# Patient Record
Sex: Female | Born: 1975 | Race: Black or African American | Hispanic: No | Marital: Single | Smoking: Never smoker
Health system: Southern US, Community
[De-identification: ages and names within clinical notes are randomized; demographics above are authoritative.]

## PROBLEM LIST (undated history)

## (undated) DIAGNOSIS — I219 Acute myocardial infarction, unspecified: Secondary | ICD-10-CM

## (undated) DIAGNOSIS — D649 Anemia, unspecified: Secondary | ICD-10-CM

## (undated) HISTORY — PX: TUBAL LIGATION: SHX77

---

## 2008-10-18 ENCOUNTER — Emergency Department: Payer: Self-pay | Admitting: Emergency Medicine

## 2009-01-11 ENCOUNTER — Emergency Department: Payer: Self-pay | Admitting: Emergency Medicine

## 2009-06-06 ENCOUNTER — Emergency Department: Payer: Self-pay | Admitting: Emergency Medicine

## 2009-09-25 ENCOUNTER — Emergency Department: Payer: Self-pay | Admitting: Emergency Medicine

## 2009-10-03 ENCOUNTER — Emergency Department: Payer: Self-pay | Admitting: Emergency Medicine

## 2009-11-04 ENCOUNTER — Emergency Department: Payer: Self-pay | Admitting: Emergency Medicine

## 2009-11-11 ENCOUNTER — Emergency Department: Payer: Self-pay | Admitting: Emergency Medicine

## 2009-12-03 ENCOUNTER — Emergency Department: Payer: Self-pay | Admitting: Emergency Medicine

## 2009-12-30 ENCOUNTER — Emergency Department: Payer: Self-pay | Admitting: Emergency Medicine

## 2011-07-27 ENCOUNTER — Emergency Department: Payer: Self-pay | Admitting: Emergency Medicine

## 2012-01-04 ENCOUNTER — Other Ambulatory Visit (HOSPITAL_COMMUNITY): Payer: Self-pay | Admitting: Family Medicine

## 2012-01-04 ENCOUNTER — Ambulatory Visit (HOSPITAL_COMMUNITY)
Admission: RE | Admit: 2012-01-04 | Discharge: 2012-01-04 | Disposition: A | Discharge status: Home / Self Care | Payer: Self-pay | Source: Ambulatory Visit | Attending: Family Medicine | Admitting: Family Medicine

## 2012-01-04 DIAGNOSIS — R1012 Left upper quadrant pain: Secondary | ICD-10-CM | POA: Insufficient documentation

## 2013-11-06 ENCOUNTER — Emergency Department: Payer: Self-pay | Admitting: Emergency Medicine

## 2013-11-06 LAB — URINALYSIS, COMPLETE
Glucose,UR: NEGATIVE mg/dL (ref 0–75)
Leukocyte Esterase: NEGATIVE
Nitrite: NEGATIVE
Ph: 5 (ref 4.5–8.0)
Protein: NEGATIVE
RBC,UR: 2 /HPF (ref 0–5)
Squamous Epithelial: 7
WBC UR: 7 /HPF (ref 0–5)

## 2013-11-06 LAB — TROPONIN I: Troponin-I: <0.02 ng/mL

## 2013-11-06 LAB — CBC
HCT: 37.3 % (ref 35.0–47.0)
HGB: 11.7 g/dL — ABNORMAL LOW (ref 12.0–16.0)
MCH: 23.4 pg — ABNORMAL LOW (ref 26.0–34.0)
MCV: 75 fL — ABNORMAL LOW (ref 80–100)
WBC: 7.4 10*3/uL (ref 3.6–11.0)

## 2013-11-06 LAB — COMPREHENSIVE METABOLIC PANEL
Alkaline Phosphatase: 66 U/L
BUN: 9 mg/dL (ref 7–18)
Calcium, Total: 9.3 mg/dL (ref 8.5–10.1)
Chloride: 106 mmol/L (ref 98–107)
Creatinine: 0.78 mg/dL (ref 0.60–1.30)
EGFR (African American): >60
EGFR (Non-African Amer.): >60
Glucose: 91 mg/dL (ref 65–99)
Potassium: 4.1 mmol/L (ref 3.5–5.1)
SGOT(AST): 30 U/L (ref 15–37)
Sodium: 137 mmol/L (ref 136–145)
Total Protein: 7.9 g/dL (ref 6.4–8.2)

## 2014-07-29 ENCOUNTER — Emergency Department: Payer: Self-pay | Admitting: Student

## 2014-07-29 LAB — COMPREHENSIVE METABOLIC PANEL
ALT: 14 U/L
ANION GAP: 8 (ref 7–16)
AST: 13 U/L — AB (ref 15–37)
Albumin: 3.3 g/dL — ABNORMAL LOW (ref 3.4–5.0)
Alkaline Phosphatase: 57 U/L
BUN: 10 mg/dL (ref 7–18)
Bilirubin,Total: 0.1 mg/dL — ABNORMAL LOW (ref 0.2–1.0)
CHLORIDE: 107 mmol/L (ref 98–107)
CO2: 22 mmol/L (ref 21–32)
CREATININE: 0.77 mg/dL (ref 0.60–1.30)
Calcium, Total: 8.8 mg/dL (ref 8.5–10.1)
EGFR (African American): >60
EGFR (Non-African Amer.): >60
Glucose: 78 mg/dL (ref 65–99)
Osmolality: 272 (ref 275–301)
POTASSIUM: 3.5 mmol/L (ref 3.5–5.1)
SODIUM: 137 mmol/L (ref 136–145)
TOTAL PROTEIN: 7.8 g/dL (ref 6.4–8.2)

## 2014-07-29 LAB — URINALYSIS, COMPLETE
BILIRUBIN, UR: NEGATIVE
Bacteria: NONE SEEN
Blood: NEGATIVE
GLUCOSE, UR: NEGATIVE mg/dL (ref 0–75)
Ketone: NEGATIVE
Nitrite: NEGATIVE
Ph: 5 (ref 4.5–8.0)
Protein: NEGATIVE
Specific Gravity: 1.024 (ref 1.003–1.030)
Squamous Epithelial: 3

## 2014-07-29 LAB — CBC
HCT: 36.6 % (ref 35.0–47.0)
HGB: 11.7 g/dL — ABNORMAL LOW (ref 12.0–16.0)
MCH: 24.6 pg — ABNORMAL LOW (ref 26.0–34.0)
MCHC: 32.1 g/dL (ref 32.0–36.0)
MCV: 77 fL — ABNORMAL LOW (ref 80–100)
PLATELETS: 167 10*3/uL (ref 150–440)
RBC: 4.76 10*6/uL (ref 3.80–5.20)
RDW: 15.8 % — ABNORMAL HIGH (ref 11.5–14.5)
WBC: 8.9 10*3/uL (ref 3.6–11.0)

## 2014-07-29 LAB — GC/CHLAMYDIA PROBE AMP

## 2014-07-29 LAB — WET PREP, GENITAL

## 2014-07-29 LAB — LIPASE, BLOOD: LIPASE: 114 U/L (ref 73–393)

## 2014-10-07 ENCOUNTER — Emergency Department: Payer: Self-pay | Admitting: Emergency Medicine

## 2014-10-07 LAB — CBC WITH DIFFERENTIAL/PLATELET
BASOS PCT: 1 %
Basophil #: 0.1 10*3/uL (ref 0.0–0.1)
EOS ABS: 0.1 10*3/uL (ref 0.0–0.7)
Eosinophil %: 0.8 %
HCT: 33.2 % — ABNORMAL LOW (ref 35.0–47.0)
HGB: 10.5 g/dL — ABNORMAL LOW (ref 12.0–16.0)
LYMPHS PCT: 19.3 %
Lymphocyte #: 2.1 10*3/uL (ref 1.0–3.6)
MCH: 23.4 pg — ABNORMAL LOW (ref 26.0–34.0)
MCHC: 31.6 g/dL — ABNORMAL LOW (ref 32.0–36.0)
MCV: 74 fL — ABNORMAL LOW (ref 80–100)
MONO ABS: 0.8 x10 3/mm (ref 0.2–0.9)
Monocyte %: 7.5 %
Neutrophil #: 7.6 10*3/uL — ABNORMAL HIGH (ref 1.4–6.5)
Neutrophil %: 71.4 %
PLATELETS: 214 10*3/uL (ref 150–440)
RBC: 4.47 10*6/uL (ref 3.80–5.20)
RDW: 15.3 % — AB (ref 11.5–14.5)
WBC: 10.6 10*3/uL (ref 3.6–11.0)

## 2014-10-07 LAB — COMPREHENSIVE METABOLIC PANEL
ANION GAP: 7 (ref 7–16)
AST: 21 U/L (ref 15–37)
Albumin: 3 g/dL — ABNORMAL LOW (ref 3.4–5.0)
Alkaline Phosphatase: 65 U/L
BUN: 11 mg/dL (ref 7–18)
Bilirubin,Total: 0.2 mg/dL (ref 0.2–1.0)
Calcium, Total: 8.2 mg/dL — ABNORMAL LOW (ref 8.5–10.1)
Chloride: 105 mmol/L (ref 98–107)
Co2: 25 mmol/L (ref 21–32)
Creatinine: 0.83 mg/dL (ref 0.60–1.30)
GLUCOSE: 85 mg/dL (ref 65–99)
Osmolality: 272 (ref 275–301)
POTASSIUM: 3.6 mmol/L (ref 3.5–5.1)
SGPT (ALT): 15 U/L
SODIUM: 137 mmol/L (ref 136–145)
Total Protein: 7 g/dL (ref 6.4–8.2)

## 2014-10-07 LAB — TROPONIN I

## 2014-10-07 LAB — LIPASE, BLOOD: Lipase: 101 U/L (ref 73–393)

## 2014-10-07 LAB — PROTIME-INR
INR: 1.1
Prothrombin Time: 14.5 secs (ref 11.5–14.7)

## 2014-10-07 LAB — MAGNESIUM: Magnesium: 1.3 mg/dL — ABNORMAL LOW

## 2015-07-14 ENCOUNTER — Encounter (HOSPITAL_COMMUNITY): Payer: Self-pay | Admitting: Emergency Medicine

## 2015-07-14 ENCOUNTER — Emergency Department (HOSPITAL_COMMUNITY)
Admission: EM | Admit: 2015-07-14 | Discharge: 2015-07-14 | Disposition: A | Discharge status: Home / Self Care | Payer: Self-pay | Attending: Emergency Medicine | Admitting: Emergency Medicine

## 2015-07-14 DIAGNOSIS — R112 Nausea with vomiting, unspecified: Secondary | ICD-10-CM | POA: Insufficient documentation

## 2015-07-14 DIAGNOSIS — Z3202 Encounter for pregnancy test, result negative: Secondary | ICD-10-CM | POA: Insufficient documentation

## 2015-07-14 DIAGNOSIS — R1013 Epigastric pain: Secondary | ICD-10-CM | POA: Insufficient documentation

## 2015-07-14 DIAGNOSIS — Z9851 Tubal ligation status: Secondary | ICD-10-CM | POA: Insufficient documentation

## 2015-07-14 DIAGNOSIS — R1012 Left upper quadrant pain: Secondary | ICD-10-CM | POA: Insufficient documentation

## 2015-07-14 LAB — CBC WITH DIFFERENTIAL/PLATELET
BASOS ABS: 0 10*3/uL (ref 0.0–0.1)
BASOS PCT: 1 % (ref 0–1)
Eosinophils Absolute: 0.2 10*3/uL (ref 0.0–0.7)
Eosinophils Relative: 3 % (ref 0–5)
HEMATOCRIT: 32.2 % — AB (ref 36.0–46.0)
HEMOGLOBIN: 10 g/dL — AB (ref 12.0–15.0)
LYMPHS PCT: 42 % (ref 12–46)
Lymphs Abs: 2.6 10*3/uL (ref 0.7–4.0)
MCH: 23.1 pg — ABNORMAL LOW (ref 26.0–34.0)
MCHC: 31.1 g/dL (ref 30.0–36.0)
MCV: 74.5 fL — AB (ref 78.0–100.0)
MONOS PCT: 9 % (ref 3–12)
Monocytes Absolute: 0.6 10*3/uL (ref 0.1–1.0)
NEUTROS ABS: 2.8 10*3/uL (ref 1.7–7.7)
NEUTROS PCT: 45 % (ref 43–77)
Platelets: 220 10*3/uL (ref 150–400)
RBC: 4.32 MIL/uL (ref 3.87–5.11)
RDW: 16.8 % — ABNORMAL HIGH (ref 11.5–15.5)
WBC: 6.2 10*3/uL (ref 4.0–10.5)

## 2015-07-14 LAB — URINE MICROSCOPIC-ADD ON

## 2015-07-14 LAB — URINALYSIS, ROUTINE W REFLEX MICROSCOPIC
Bilirubin Urine: NEGATIVE
Glucose, UA: NEGATIVE mg/dL
Ketones, ur: NEGATIVE mg/dL
Leukocytes, UA: NEGATIVE
NITRITE: NEGATIVE
Protein, ur: NEGATIVE mg/dL
UROBILINOGEN UA: 0.2 mg/dL (ref 0.0–1.0)
pH: 6 (ref 5.0–8.0)

## 2015-07-14 LAB — COMPREHENSIVE METABOLIC PANEL
ALBUMIN: 3.4 g/dL — AB (ref 3.5–5.0)
ALT: 10 U/L — ABNORMAL LOW (ref 14–54)
ANION GAP: 8 (ref 5–15)
AST: 14 U/L — AB (ref 15–41)
Alkaline Phosphatase: 51 U/L (ref 38–126)
BILIRUBIN TOTAL: 0.2 mg/dL — AB (ref 0.3–1.2)
BUN: 13 mg/dL (ref 6–20)
CHLORIDE: 107 mmol/L (ref 101–111)
CO2: 24 mmol/L (ref 22–32)
Calcium: 8.7 mg/dL — ABNORMAL LOW (ref 8.9–10.3)
Creatinine, Ser: 0.8 mg/dL (ref 0.44–1.00)
GFR calc Af Amer: >60 mL/min (ref >60)
GFR calc non Af Amer: >60 mL/min (ref >60)
GLUCOSE: 94 mg/dL (ref 65–99)
POTASSIUM: 3.7 mmol/L (ref 3.5–5.1)
Sodium: 139 mmol/L (ref 135–145)
TOTAL PROTEIN: 7 g/dL (ref 6.5–8.1)

## 2015-07-14 LAB — LIPASE, BLOOD: LIPASE: 18 U/L — AB (ref 22–51)

## 2015-07-14 LAB — POC URINE PREG, ED: PREG TEST UR: NEGATIVE

## 2015-07-14 MED ORDER — SUCRALFATE 1 G PO TABS: 1.0000 g | ORAL_TABLET | Freq: Three times a day (TID) | ORAL | Status: DC

## 2015-07-14 MED ORDER — PANTOPRAZOLE SODIUM 40 MG PO TBEC
40.0000 mg | DELAYED_RELEASE_TABLET | Freq: Once | ORAL | Status: AC
  Administered 2015-07-14: 40 mg via ORAL
  Filled 2015-07-14: qty 1

## 2015-07-14 MED ORDER — OMEPRAZOLE 20 MG PO CPDR: 20.0000 mg | DELAYED_RELEASE_CAPSULE | Freq: Every day | ORAL | Status: DC

## 2015-07-14 NOTE — ED Provider Notes (Signed)
CSN: [id number]     Arrival date & time 07/14/15  4098 History  This chart was scribed for Kara Nay, MD by Budd Palmer, ED Scribe. This patient was seen in room APA07/APA07 and the patient's care was started at 8:43 PM.    Chief Complaint  Patient presents with  . Abdominal Pain   The history is provided by the patient. No language interpreter was used.   HPI Comments: Kara Pearson is a 39 y.o. female who presents to the Emergency Department complaining of constant, worsening, upper abdominal pain onset 5 weeks ago. She notes associated nausea (onset 5 weeks ago), and states she is unable to keep down food. She has taken a home pregnancy test which came back negative. She has had an abnormal last menstrual period. Pt denies melena and diarrhea, as well as urinary symptoms.  History reviewed. No pertinent past medical history. Past Surgical History  Procedure Laterality Date  . Tubal ligation     History reviewed. No pertinent family history. Social History  Substance Use Topics  . Smoking status: Never Smoker   . Smokeless tobacco: Never Used  . Alcohol Use: No   OB History    No data available     Review of Systems  Gastrointestinal: Positive for nausea, vomiting and abdominal pain. Negative for diarrhea and blood in stool.  Genitourinary: Negative for dysuria and difficulty urinating.  All other systems reviewed and are negative.   Allergies  Review of patient's allergies indicates no known allergies.  Home Medications   Prior to Admission medications   Medication Sig Start Date End Date Taking? Authorizing Provider  omeprazole (PRILOSEC) 20 MG capsule Take 1 capsule (20 mg total) by mouth daily. 07/14/15   Kara Nay, MD  sucralfate (CARAFATE) 1 G tablet Take 1 tablet (1 g total) by mouth 4 (four) times daily -  with meals and at bedtime. 07/14/15   Kara Nay, MD   BP 135/65 mmHg  Pulse 72  Temp(Src) 98.2 F (36.8 C) (Oral)  Resp 20  Ht 5' 7.5" (1.715  m)  Wt 245 lb (111.131 kg)  BMI 37.78 kg/m2  SpO2 99%  LMP 07/09/2015 Physical Exam  Constitutional: She is oriented to person, place, and time. She appears well-developed and well-nourished. No distress.  HENT:  Head: Normocephalic and atraumatic.  Eyes: Pupils are equal, round, and reactive to light.  Neck: Normal range of motion.  Cardiovascular: Normal rate and intact distal pulses.   Pulmonary/Chest: No respiratory distress.  Abdominal: Soft. Normal appearance. She exhibits no distension. There is tenderness in the epigastric area and left upper quadrant.    Pain primarily in the left upper and mid epigastric area.  Minimal discomfort in the right upper quadrant  Musculoskeletal: Normal range of motion.  Neurological: She is alert and oriented to person, place, and time. No cranial nerve deficit.  Skin: Skin is warm and dry. No rash noted.  Psychiatric: She has a normal mood and affect. Her behavior is normal.  Nursing note and vitals reviewed.   ED Course  Procedures  DIAGNOSTIC STUDIES: Oxygen Saturation is 100% on RA, normal by my interpretation.    COORDINATION OF CARE: 8:46 PM - Discussed plans to order diagnostic studies. Pt advised of plan for treatment and pt agrees.  Labs Review Labs Reviewed  COMPREHENSIVE METABOLIC PANEL - Abnormal; Notable for the following:    Calcium 8.7 (*)    Albumin 3.4 (*)    AST 14 (*)    ALT  10 (*)    Total Bilirubin 0.2 (*)    All other components within normal limits  LIPASE, BLOOD - Abnormal; Notable for the following:    Lipase 18 (*)    All other components within normal limits  CBC WITH DIFFERENTIAL/PLATELET - Abnormal; Notable for the following:    Hemoglobin 10.0 (*)    HCT 32.2 (*)    MCV 74.5 (*)    MCH 23.1 (*)    RDW 16.8 (*)    All other components within normal limits  URINALYSIS, ROUTINE W REFLEX MICROSCOPIC (NOT AT [facility]) - Abnormal; Notable for the following:    Specific Gravity, Urine >1.030 (*)    Hgb  urine dipstick TRACE (*)    All other components within normal limits  URINE MICROSCOPIC-ADD ON - Abnormal; Notable for the following:    Squamous Epithelial / LPF FEW (*)    All other components within normal limits  POC URINE PREG, ED    Imaging Review No results found. I, Nikeshia Keetch L, personally reviewed and evaluated these images and lab results as part of my medical decision-making.    MDM   Final diagnoses:  Epigastric pain   I personally performed the services described in this documentation, which was scribed in my presence. The recorded information has been reviewed and considered.   Kara Nay, MD 07/17/15 2005

## 2015-07-14 NOTE — ED Notes (Signed)
Patient reports nausea and discomfort to upper abdomen. Denies vomiting and diarrhea, denies urinary symptoms, and denies vaginal discharge.

## 2015-07-14 NOTE — Discharge Instructions (Signed)
Pain of Unknown Etiology (Pain Without a Known Cause) You have come to your caregiver because of pain. Pain can occur in any part of the body. Often there is not a definite cause. If your laboratory (blood or urine) work was normal and X-rays or other studies were normal, your caregiver may treat you without knowing the cause of the pain. An example of this is the headache. Most headaches are diagnosed by taking a history. This means your caregiver asks you questions about your headaches. Your caregiver determines a treatment based on your answers. Usually testing done for headaches is normal. Often testing is not done unless there is no response to medications. Regardless of where your pain is located today, you can be given medications to make you comfortable. If no physical cause of pain can be found, most cases of pain will gradually leave as suddenly as they came.  If you have a painful condition and no reason can be found for the pain, it is important that you follow up with your caregiver. If the pain becomes worse or does not go away, it may be necessary to repeat tests and look further for a possible cause.  Only take over-the-counter or prescription medicines for pain, discomfort, or fever as directed by your caregiver.  For the protection of your privacy, test results cannot be given over the phone. Make sure you receive the results of your test. Ask how these results are to be obtained if you have not been informed. It is your responsibility to obtain your test results.  You may continue all activities unless the activities cause more pain. When the pain lessens, it is important to gradually resume normal activities. Resume activities by beginning slowly and gradually increasing the intensity and duration of the activities or exercise. During periods of severe pain, bed rest may be helpful. Lie or sit in any position that is comfortable.  Ice used for acute (sudden) conditions may be effective.  Use a large plastic bag filled with ice and wrapped in a towel. This may provide pain relief.  See your caregiver for continued problems. Your caregiver can help or refer you for exercises or physical therapy if necessary. If you were given medications for your condition, do not drive, operate machinery or power tools, or sign legal documents for 24 hours. Do not drink alcohol, take sleeping pills, or take other medications that may interfere with treatment. See your caregiver immediately if you have pain that is becoming worse and not relieved by medications. Document Released: 08/10/2001 Document Revised: 09/05/2013 Document Reviewed: 11/15/2005 [facility] Patient Information 2015 [place], [place]. This information is not intended to replace advice given to you by your health care provider. Make sure you discuss any questions you have with your health care provider.  Abdominal Pain, Women Abdominal (stomach, pelvic, or belly) pain can be caused by many things. It is important to tell your doctor:  The location of the pain.  Does it come and go or is it present all the time?  Are there things that start the pain (eating certain foods, exercise)?  Are there other symptoms associated with the pain (fever, nausea, vomiting, diarrhea)? All of this is helpful to know when trying to find the cause of the pain. CAUSES   Stomach: virus or bacteria infection, or ulcer.  Intestine: appendicitis (inflamed appendix), regional ileitis (Crohn's disease), ulcerative colitis (inflamed colon), irritable bowel syndrome, diverticulitis (inflamed diverticulum of the colon), or cancer of the stomach or intestine.  Gallbladder disease or stones in the gallbladder.  Kidney disease, kidney stones, or infection.  Pancreas infection or cancer.  Fibromyalgia (pain disorder).  Diseases of the female organs:  Uterus: fibroid (non-cancerous) tumors or infection.  Fallopian tubes: infection or tubal  pregnancy.  Ovary: cysts or tumors.  Pelvic adhesions (scar tissue).  Endometriosis (uterus lining tissue growing in the pelvis and on the pelvic organs).  Pelvic congestion syndrome (female organs filling up with blood just before the menstrual period).  Pain with the menstrual period.  Pain with ovulation (producing an egg).  Pain with an IUD (intrauterine device, birth control) in the uterus.  Cancer of the female organs.  Functional pain (pain not caused by a disease, may improve without treatment).  Psychological pain.  Depression. DIAGNOSIS  Your doctor will decide the seriousness of your pain by doing an examination.  Blood tests.  X-rays.  Ultrasound.  CT scan (computed tomography, special type of X-ray).  MRI (magnetic resonance imaging).  Cultures, for infection.  Barium enema (dye inserted in the large intestine, to better view it with X-rays).  Colonoscopy (looking in intestine with a lighted tube).  Laparoscopy (minor surgery, looking in abdomen with a lighted tube).  Major abdominal exploratory surgery (looking in abdomen with a large incision). TREATMENT  The treatment will depend on the cause of the pain.   Many cases can be observed and treated at home.  Over-the-counter medicines recommended by your caregiver.  Prescription medicine.  Antibiotics, for infection.  Birth control pills, for painful periods or for ovulation pain.  Hormone treatment, for endometriosis.  Nerve blocking injections.  Physical therapy.  Antidepressants.  Counseling with a psychologist or psychiatrist.  Minor or major surgery. HOME CARE INSTRUCTIONS   Do not take laxatives, unless directed by your caregiver.  Take over-the-counter pain medicine only if ordered by your caregiver. Do not take aspirin because it can cause an upset stomach or bleeding.  Try a clear liquid diet (broth or water) as ordered by your caregiver. Slowly move to a bland diet, as  tolerated, if the pain is related to the stomach or intestine.  Have a thermometer and take your temperature several times a day, and record it.  Bed rest and sleep, if it helps the pain.  Avoid sexual intercourse, if it causes pain.  Avoid stressful situations.  Keep your follow-up appointments and tests, as your caregiver orders.  If the pain does not go away with medicine or surgery, you may try:  Acupuncture.  Relaxation exercises (yoga, meditation).  Group therapy.  Counseling. SEEK MEDICAL CARE IF:   You notice certain foods cause stomach pain.  Your home care treatment is not helping your pain.  You need stronger pain medicine.  You want your IUD removed.  You feel faint or lightheaded.  You develop nausea and vomiting.  You develop a rash.  You are having side effects or an allergy to your medicine. SEEK IMMEDIATE MEDICAL CARE IF:   Your pain does not go away or gets worse.  You have a fever.  Your pain is felt only in portions of the abdomen. The right side could possibly be appendicitis. The left lower portion of the abdomen could be colitis or diverticulitis.  You are passing blood in your stools (bright red or black tarry stools, with or without vomiting).  You have blood in your urine.  You develop chills, with or without a fever.  You pass out. MAKE SURE YOU:   Understand these instructions.  Will watch your condition.  Will get help right away if you are not doing well or get worse. Document Released: 09/12/2007 Document Revised: 04/01/2014 Document Reviewed: 10/02/2009 [facility] Patient Information 2015 [place], [place]. This information is not intended to replace advice given to you by your health care provider. Make sure you discuss any questions you have with your health care provider.

## 2016-01-27 ENCOUNTER — Emergency Department
Admission: EM | Admit: 2016-01-27 | Discharge: 2016-01-27 | Disposition: A | Discharge status: Home / Self Care | Payer: Self-pay | Attending: Emergency Medicine | Admitting: Emergency Medicine

## 2016-01-27 ENCOUNTER — Encounter: Payer: Self-pay | Admitting: Emergency Medicine

## 2016-01-27 ENCOUNTER — Emergency Department: Payer: Self-pay

## 2016-01-27 DIAGNOSIS — Z79899 Other long term (current) drug therapy: Secondary | ICD-10-CM | POA: Insufficient documentation

## 2016-01-27 DIAGNOSIS — R0789 Other chest pain: Secondary | ICD-10-CM | POA: Insufficient documentation

## 2016-01-27 LAB — CBC
HCT: 33.4 % — ABNORMAL LOW (ref 35.0–47.0)
Hemoglobin: 10.7 g/dL — ABNORMAL LOW (ref 12.0–16.0)
MCH: 23.3 pg — ABNORMAL LOW (ref 26.0–34.0)
MCHC: 32.2 g/dL (ref 32.0–36.0)
MCV: 72.6 fL — AB (ref 80.0–100.0)
PLATELETS: 165 10*3/uL (ref 150–440)
RBC: 4.6 MIL/uL (ref 3.80–5.20)
RDW: 17.6 % — AB (ref 11.5–14.5)
WBC: 5.3 10*3/uL (ref 3.6–11.0)

## 2016-01-27 LAB — BASIC METABOLIC PANEL WITH GFR
Anion gap: 5 (ref 5–15)
BUN: 12 mg/dL (ref 6–20)
CO2: 23 mmol/L (ref 22–32)
Calcium: 8.7 mg/dL — ABNORMAL LOW (ref 8.9–10.3)
Chloride: 110 mmol/L (ref 101–111)
Creatinine, Ser: 0.67 mg/dL (ref 0.44–1.00)
GFR calc Af Amer: >60 mL/min
GFR calc non Af Amer: >60 mL/min
Glucose, Bld: 105 mg/dL — ABNORMAL HIGH (ref 65–99)
Potassium: 3.6 mmol/L (ref 3.5–5.1)
Sodium: 138 mmol/L (ref 135–145)

## 2016-01-27 LAB — FIBRIN DERIVATIVES D-DIMER (ARMC ONLY): Fibrin derivatives D-dimer (ARMC): 627 — ABNORMAL HIGH (ref 0–499)

## 2016-01-27 LAB — TROPONIN I: Troponin I: <0.03 ng/mL (ref <0.031)

## 2016-01-27 MED ORDER — KETOROLAC TROMETHAMINE 30 MG/ML IJ SOLN
30.0000 mg | Freq: Once | INTRAMUSCULAR | Status: AC
  Administered 2016-01-27: 30 mg via INTRAMUSCULAR

## 2016-01-27 MED ORDER — NAPROXEN 500 MG PO TABS: 500.0000 mg | ORAL_TABLET | Freq: Two times a day (BID) | ORAL | Status: DC

## 2016-01-27 MED ORDER — KETOROLAC TROMETHAMINE 30 MG/ML IJ SOLN
INTRAMUSCULAR | Status: AC
  Administered 2016-01-27: 30 mg via INTRAMUSCULAR
  Filled 2016-01-27: qty 1

## 2016-01-27 NOTE — ED Notes (Signed)
C/o chest pain with inspiration.  Onset of symptoms today at 0830.

## 2016-01-27 NOTE — ED Provider Notes (Signed)
[facility] Emergency Department Provider Note  ____________________________________________    I have reviewed the triage vital signs and the nursing notes.   HISTORY  Chief Complaint Chest Pain    HPI STEPHANY POORMAN is a 40 y.o. female who presents with complaints of chest pain. Patient reports she works at [employer] and pushes heavy objects around most of the day. Today she developed discomfort in her superior bilateral chest worse with pushing objects. She complains the pain is aching in nature. She reports this started approximately 8:30 this morning soon after arriving to work. She denies shortness of breath. No recent travel. No calf pain. No diaphoresis. No history of heart disease     History reviewed. No pertinent past medical history.  There are no active problems to display for this patient.   Past Surgical History  Procedure Laterality Date  . Tubal ligation      Current Outpatient Rx  Name  Route  Sig  Dispense  Refill  . omeprazole (PRILOSEC) 20 MG capsule   Oral   Take 1 capsule (20 mg total) by mouth daily.   20 capsule   0   . sucralfate (CARAFATE) 1 G tablet   Oral   Take 1 tablet (1 g total) by mouth 4 (four) times daily -  with meals and at bedtime.   10 tablet   0     Allergies Review of patient's allergies indicates no known allergies.  No family history on file.  Social History Social History  Substance Use Topics  . Smoking status: Never Smoker   . Smokeless tobacco: Never Used  . Alcohol Use: No    Review of Systems  Constitutional: Negative for fever. Eyes: Negative for visual changes. ENT: Negative for neck pain Cardiovascular: As above Respiratory: Negative for shortness of breath. No cough Gastrointestinal: Negative for abdominal pain, Genitourinary: Negative for dysuria. Musculoskeletal: Negative for back pain. Skin: Negative for rash. No diaphoresis Neurological: Negative for focal  weakness Psychiatric: No anxiety    ____________________________________________   PHYSICAL EXAM:  VITAL SIGNS: ED Triage Vitals  Enc Vitals Group     BP 01/27/16 1142 125/76 mmHg     Pulse Rate 01/27/16 1142 75     Resp 01/27/16 1142 16     Temp 01/27/16 1142 98.4 F (36.9 C)     Temp Source 01/27/16 1142 Oral     SpO2 01/27/16 1142 99 %     Weight 01/27/16 1142 235 lb (106.595 kg)     Height 01/27/16 1142  (1.727 m)     Head Cir --      Peak Flow --      Pain Score 01/27/16 1143 7     Pain Loc --      Pain Edu? --      Excl. in GC? --      Constitutional: Alert and oriented. Well appearing and in no distress. Eyes: Conjunctivae are normal.  ENT   Head: Normocephalic and atraumatic.   Mouth/Throat: Mucous membranes are moist. Cardiovascular: Normal rate, regular rhythm. Normal and symmetric distal pulses are present in all extremities. No murmurs, rubs, or gallops. Patient with reproducible tenderness to palpation along the bilateral pectoralis major muscles and along the borders of the superior sternum, worse with extension of the arms Respiratory: Normal respiratory effort without tachypnea nor retractions. Breath sounds are clear and equal bilaterally.  Gastrointestinal: Soft and non-tender in all quadrants. No distention. There is no CVA tenderness.  Genitourinary: deferred Musculoskeletal: Nontender with normal range of motion in all extremities. No lower extremity tenderness nor edema. Neurologic:  Normal speech and language. No gross focal neurologic deficits are appreciated. Skin:  Skin is warm, dry and intact. No rash noted. Psychiatric: Mood and affect are normal. Patient exhibits appropriate insight and judgment.  ____________________________________________    LABS (pertinent positives/negatives)  Labs Reviewed  BASIC METABOLIC PANEL - Abnormal; Notable for the following:    Glucose, Bld 105 (*)    Calcium 8.7 (*)    All other components  within normal limits  CBC - Abnormal; Notable for the following:    Hemoglobin 10.7 (*)    HCT 33.4 (*)    MCV 72.6 (*)    MCH 23.3 (*)    RDW 17.6 (*)    All other components within normal limits  TROPONIN I  FIBRIN DERIVATIVES D-DIMER ([facility] ONLY)    ____________________________________________   EKG  ED ECG REPORT I, Jene Every, the attending physician, personally viewed and interpreted this ECG.  Date: 01/27/2016 EKG Time: 11:38 AM Rate: 76 Rhythm: normal sinus rhythm QRS Axis: normal Intervals: normal ST/T Wave abnormalities: normal Conduction Disturbances: none Narrative Interpretation: unremarkable   ____________________________________________    RADIOLOGY I have personally reviewed any xrays that were ordered on this patient: Chest x-ray normal  ____________________________________________   PROCEDURES  Procedure(s) performed: none  Critical Care performed: none  ____________________________________________   INITIAL IMPRESSION / ASSESSMENT AND PLAN / ED COURSE  Pertinent labs & imaging results that were available during my care of the patient were reviewed by me and considered in my medical decision making (see chart for details).  Patient well-appearing and in no distress. Her EKG is reassuring. Her troponin is normal. History of present illness is not consistent with ACS, dissection, myocarditis. I have added on a d-dimer although risk for PE is very low. I suspect muscular skeletal chest pain. We will give Toradol IM and reevaluated  ----------------------------------------- 3:02 PM on 01/27/2016 -----------------------------------------  Patient has had total relief of her chest discomfort after the Toradol. I discussed her mildly elevated d-dimer with her. Recommended CT scan despite the very low risk of blood clot but she declined. She opted to return if her pain returns.  ____________________________________________   FINAL  CLINICAL IMPRESSION(S) / ED DIAGNOSES  Final diagnoses:  Anterior chest wall pain     Jene Every, MD 01/27/16 1943

## 2016-01-27 NOTE — Discharge Instructions (Signed)

## 2016-05-17 ENCOUNTER — Emergency Department (HOSPITAL_COMMUNITY): Payer: Self-pay

## 2016-05-17 ENCOUNTER — Encounter (HOSPITAL_COMMUNITY): Payer: Self-pay | Admitting: Emergency Medicine

## 2016-05-17 ENCOUNTER — Emergency Department (HOSPITAL_COMMUNITY)
Admission: EM | Admit: 2016-05-17 | Discharge: 2016-05-18 | Disposition: A | Discharge status: Home / Self Care | Payer: Self-pay | Attending: Emergency Medicine | Admitting: Emergency Medicine

## 2016-05-17 DIAGNOSIS — R1030 Lower abdominal pain, unspecified: Secondary | ICD-10-CM

## 2016-05-17 DIAGNOSIS — N760 Acute vaginitis: Secondary | ICD-10-CM | POA: Insufficient documentation

## 2016-05-17 DIAGNOSIS — R112 Nausea with vomiting, unspecified: Secondary | ICD-10-CM | POA: Insufficient documentation

## 2016-05-17 DIAGNOSIS — A599 Trichomoniasis, unspecified: Secondary | ICD-10-CM

## 2016-05-17 DIAGNOSIS — Z6834 Body mass index (BMI) 34.0-34.9, adult: Secondary | ICD-10-CM | POA: Insufficient documentation

## 2016-05-17 DIAGNOSIS — R102 Pelvic and perineal pain: Secondary | ICD-10-CM

## 2016-05-17 DIAGNOSIS — B9689 Other specified bacterial agents as the cause of diseases classified elsewhere: Secondary | ICD-10-CM

## 2016-05-17 DIAGNOSIS — E669 Obesity, unspecified: Secondary | ICD-10-CM | POA: Insufficient documentation

## 2016-05-17 LAB — CBC
HCT: 34.7 % — ABNORMAL LOW (ref 36.0–46.0)
HEMOGLOBIN: 10.9 g/dL — AB (ref 12.0–15.0)
MCH: 23.9 pg — ABNORMAL LOW (ref 26.0–34.0)
MCHC: 31.4 g/dL (ref 30.0–36.0)
MCV: 75.9 fL — ABNORMAL LOW (ref 78.0–100.0)
PLATELETS: 219 10*3/uL (ref 150–400)
RBC: 4.57 MIL/uL (ref 3.87–5.11)
RDW: 17.2 % — ABNORMAL HIGH (ref 11.5–15.5)
WBC: 6.3 10*3/uL (ref 4.0–10.5)

## 2016-05-17 LAB — COMPREHENSIVE METABOLIC PANEL WITH GFR
ALT: 9 U/L — ABNORMAL LOW (ref 14–54)
AST: 14 U/L — ABNORMAL LOW (ref 15–41)
Albumin: 3.7 g/dL (ref 3.5–5.0)
Alkaline Phosphatase: 51 U/L (ref 38–126)
Anion gap: 5 (ref 5–15)
BUN: 12 mg/dL (ref 6–20)
CO2: 25 mmol/L (ref 22–32)
Calcium: 8.7 mg/dL — ABNORMAL LOW (ref 8.9–10.3)
Chloride: 109 mmol/L (ref 101–111)
Creatinine, Ser: 0.81 mg/dL (ref 0.44–1.00)
GFR calc Af Amer: >60 mL/min
GFR calc non Af Amer: >60 mL/min
Glucose, Bld: 87 mg/dL (ref 65–99)
Potassium: 3.5 mmol/L (ref 3.5–5.1)
Sodium: 139 mmol/L (ref 135–145)
Total Bilirubin: 0.3 mg/dL (ref 0.3–1.2)
Total Protein: 7.4 g/dL (ref 6.5–8.1)

## 2016-05-17 LAB — URINALYSIS, ROUTINE W REFLEX MICROSCOPIC
BILIRUBIN URINE: NEGATIVE
Glucose, UA: NEGATIVE mg/dL
Ketones, ur: NEGATIVE mg/dL
Leukocytes, UA: NEGATIVE
NITRITE: NEGATIVE
pH: 5.5 (ref 5.0–8.0)

## 2016-05-17 LAB — URINE MICROSCOPIC-ADD ON

## 2016-05-17 LAB — WET PREP, GENITAL
Sperm: NONE SEEN
YEAST WET PREP: NONE SEEN

## 2016-05-17 LAB — LIPASE, BLOOD: Lipase: 18 U/L (ref 11–51)

## 2016-05-17 LAB — PREGNANCY, URINE: Preg Test, Ur: NEGATIVE

## 2016-05-17 MED ORDER — DIATRIZOATE MEGLUMINE & SODIUM 66-10 % PO SOLN
ORAL | Status: AC
  Administered 2016-05-17: 21:00:00
  Filled 2016-05-17: qty 30

## 2016-05-17 MED ORDER — IOPAMIDOL (ISOVUE-300) INJECTION 61%
100.0000 mL | Freq: Once | INTRAVENOUS | Status: AC | PRN
  Administered 2016-05-17: 100 mL via INTRAVENOUS

## 2016-05-17 MED ORDER — OXYCODONE-ACETAMINOPHEN 5-325 MG PO TABS: 1.0000 | ORAL_TABLET | ORAL | Status: DC | PRN

## 2016-05-17 MED ORDER — SODIUM CHLORIDE 0.9 % IV BOLUS (SEPSIS)
1000.0000 mL | Freq: Once | INTRAVENOUS | Status: AC
  Administered 2016-05-17: 1000 mL via INTRAVENOUS

## 2016-05-17 MED ORDER — METRONIDAZOLE 500 MG PO TABS: 500.0000 mg | ORAL_TABLET | Freq: Two times a day (BID) | ORAL | Status: DC

## 2016-05-17 NOTE — Discharge Instructions (Signed)
You have an infection called bacterial vaginosis and trichomonas. Prescription for antibiotic and pain medicine. Follow-up your primary care doctor.

## 2016-05-17 NOTE — ED Provider Notes (Addendum)
CSN: [id number]     Arrival date & time 05/17/16  1832 History   First MD Initiated Contact with Patient 05/17/16 1934     Chief Complaint  Patient presents with  . Abdominal Pain  . Emesis     (Consider location/radiation/quality/duration/timing/severity/associated sxs/prior Treatment) HPI.Marland KitchenMarland KitchenMarland KitchenLower abdominal pain for 3 weeks. This is atypical for patient. She is normally healthy. Her menstrual period has been erratic. Status post tubal ligation. She is sexually active with her boyfriend. Review systems positive for nausea and vomiting. Minimal vaginal discharge. No vaginal bleeding. She is drinking fluids but not eating as well.  History reviewed. No pertinent past medical history. Past Surgical History  Procedure Laterality Date  . Tubal ligation     History reviewed. No pertinent family history. Social History  Substance Use Topics  . Smoking status: Never Smoker   . Smokeless tobacco: Never Used  . Alcohol Use: No   OB History    No data available     Review of Systems  All other systems reviewed and are negative.     Allergies  Review of patient's allergies indicates no known allergies.  Home Medications   Prior to Admission medications   Not on File   BP 133/86 mmHg  Pulse 75  Temp(Src) 98.6 F (37 C) (Oral)  Resp 18  Ht  (1.727 m)  Wt 230 lb (104.327 kg)  BMI 34.98 kg/m2  SpO2 100%  LMP 05/12/2016 Physical Exam  Constitutional: She is oriented to person, place, and time.  Obese, no acute distress  HENT:  Head: Normocephalic and atraumatic.  Eyes: Conjunctivae and EOM are normal. Pupils are equal, round, and reactive to light.  Neck: Normal range of motion. Neck supple.  Cardiovascular: Normal rate and regular rhythm.   Pulmonary/Chest: Effort normal and breath sounds normal.  Abdominal: Soft. Bowel sounds are normal.  Minimal lower abdominal tenderness  Genitourinary:  Pelvic exam: Normal introitus. Cervix is slightly tender with a frothy  discharge. Uterus is slightly tender. No adnexal masses  Musculoskeletal: Normal range of motion.  Neurological: She is alert and oriented to person, place, and time.  Skin: Skin is warm and dry.  Psychiatric: She has a normal mood and affect. Her behavior is normal.  Nursing note and vitals reviewed.   ED Course  Procedures (including critical care time) Labs Review Labs Reviewed  WET PREP, GENITAL - Abnormal; Notable for the following:    Trich, Wet Prep PRESENT (*)    Clue Cells Wet Prep HPF POC PRESENT (*)    WBC, Wet Prep HPF POC FEW (*)    All other components within normal limits  COMPREHENSIVE METABOLIC PANEL - Abnormal; Notable for the following:    Calcium 8.7 (*)    AST 14 (*)    ALT 9 (*)    All other components within normal limits  CBC - Abnormal; Notable for the following:    Hemoglobin 10.9 (*)    HCT 34.7 (*)    MCV 75.9 (*)    MCH 23.9 (*)    RDW 17.2 (*)    All other components within normal limits  URINALYSIS, ROUTINE W REFLEX MICROSCOPIC (NOT AT [facility]) - Abnormal; Notable for the following:    Specific Gravity, Urine >1.030 (*)    Hgb urine dipstick TRACE (*)    Protein, ur TRACE (*)    All other components within normal limits  URINE MICROSCOPIC-ADD ON - Abnormal; Notable for the following:    Squamous Epithelial /  LPF 6-30 (*)    Bacteria, UA MANY (*)    All other components within normal limits  LIPASE, BLOOD  PREGNANCY, URINE  GC/CHLAMYDIA PROBE AMP ([place]) [facility]    Imaging Review No results found. I have personally reviewed and evaluated these images and lab results as part of my medical decision-making.   EKG Interpretation None      MDM   Final diagnoses:  Lower abdominal pain  Trichomonas infection  Bacterial vaginosis    Patient has Trichomonas and bacterial vaginosis. CT abdomen/pelvis pending.Discharge medications Flagyl 500 mg and Percocet. Discussed with Dr. Anastasia Fiedlerancour    Omeka Holben, MD 05/17/16 [id number]  Donnetta HutchingBrian  Anthony Tamburo, MD 05/17/16 2204

## 2016-05-17 NOTE — ED Provider Notes (Signed)
CT scan reassuring. Fibroid seen without acute pathology. She is tender in the right lower quadrant and lower abdomen.  Treat for bacterial vaginosis and Trichomonas per plan of Dr. Adriana Simasook. We'll obtain pelvic ultrasound tomorrow. Low suspicion for ovarian torsion.  BP 129/90 mmHg  Pulse 70  Temp(Src) 97.9 F (36.6 C) (Oral)  Resp 19  Ht 5\' 8"  (1.727 m)  Wt 230 lb (104.327 kg)  BMI 34.98 kg/m2  SpO2 97%  LMP 05/12/2016    Glynn OctaveStephen Kristi Norment, MD 05/17/16 2344

## 2016-05-17 NOTE — ED Notes (Signed)
Patient complaining of lower abdominal pain and vomiting off and on x 3 weeks.

## 2016-05-18 ENCOUNTER — Ambulatory Visit (HOSPITAL_COMMUNITY): Admit: 2016-05-18 | Payer: Self-pay

## 2016-05-18 ENCOUNTER — Inpatient Hospital Stay (HOSPITAL_COMMUNITY): Admit: 2016-05-18 | Payer: Self-pay

## 2016-05-18 ENCOUNTER — Other Ambulatory Visit (HOSPITAL_COMMUNITY): Payer: Self-pay | Admitting: Emergency Medicine

## 2016-05-18 DIAGNOSIS — R102 Pelvic and perineal pain: Secondary | ICD-10-CM

## 2016-05-19 LAB — GC/CHLAMYDIA PROBE AMP (~~LOC~~) NOT AT ARMC
Chlamydia: NEGATIVE
Neisseria Gonorrhea: NEGATIVE

## 2016-05-19 LAB — URINE CULTURE: Culture: NO GROWTH

## 2016-07-19 ENCOUNTER — Encounter (HOSPITAL_COMMUNITY): Payer: Self-pay | Admitting: Emergency Medicine

## 2016-07-19 ENCOUNTER — Emergency Department (HOSPITAL_COMMUNITY)
Admission: EM | Admit: 2016-07-19 | Discharge: 2016-07-19 | Disposition: A | Discharge status: Home / Self Care | Payer: No Typology Code available for payment source | Attending: Emergency Medicine | Admitting: Emergency Medicine

## 2016-07-19 DIAGNOSIS — Z791 Long term (current) use of non-steroidal anti-inflammatories (NSAID): Secondary | ICD-10-CM | POA: Diagnosis not present

## 2016-07-19 DIAGNOSIS — Y999 Unspecified external cause status: Secondary | ICD-10-CM | POA: Insufficient documentation

## 2016-07-19 DIAGNOSIS — S39012A Strain of muscle, fascia and tendon of lower back, initial encounter: Secondary | ICD-10-CM | POA: Diagnosis not present

## 2016-07-19 DIAGNOSIS — Y939 Activity, unspecified: Secondary | ICD-10-CM | POA: Insufficient documentation

## 2016-07-19 DIAGNOSIS — S199XXA Unspecified injury of neck, initial encounter: Secondary | ICD-10-CM | POA: Diagnosis present

## 2016-07-19 DIAGNOSIS — Y92410 Unspecified street and highway as the place of occurrence of the external cause: Secondary | ICD-10-CM | POA: Diagnosis not present

## 2016-07-19 DIAGNOSIS — S161XXA Strain of muscle, fascia and tendon at neck level, initial encounter: Secondary | ICD-10-CM | POA: Diagnosis not present

## 2016-07-19 MED ORDER — METHOCARBAMOL 500 MG PO TABS: 500.0000 mg | ORAL_TABLET | Freq: Two times a day (BID) | ORAL | 0 refills | Status: DC

## 2016-07-19 MED ORDER — DICLOFENAC SODIUM 50 MG PO TBEC: 50.0000 mg | DELAYED_RELEASE_TABLET | Freq: Two times a day (BID) | ORAL | 0 refills | Status: DC
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
## 2016-08-11 ENCOUNTER — Emergency Department: Payer: Medicaid Other

## 2016-08-11 ENCOUNTER — Inpatient Hospital Stay
Admission: EM | Admit: 2016-08-11 | Discharge: 2016-08-13 | DRG: 287 | Disposition: A | Discharge status: Home / Self Care | Payer: Medicaid Other | Attending: Internal Medicine | Admitting: Internal Medicine

## 2016-08-11 ENCOUNTER — Encounter: Payer: Self-pay | Admitting: Emergency Medicine

## 2016-08-11 DIAGNOSIS — R778 Other specified abnormalities of plasma proteins: Secondary | ICD-10-CM | POA: Diagnosis present

## 2016-08-11 DIAGNOSIS — R0789 Other chest pain: Principal | ICD-10-CM | POA: Diagnosis present

## 2016-08-11 DIAGNOSIS — I214 Non-ST elevation (NSTEMI) myocardial infarction: Secondary | ICD-10-CM | POA: Diagnosis present

## 2016-08-11 DIAGNOSIS — E669 Obesity, unspecified: Secondary | ICD-10-CM | POA: Diagnosis present

## 2016-08-11 DIAGNOSIS — Z6835 Body mass index (BMI) 35.0-35.9, adult: Secondary | ICD-10-CM

## 2016-08-11 DIAGNOSIS — K219 Gastro-esophageal reflux disease without esophagitis: Secondary | ICD-10-CM | POA: Diagnosis present

## 2016-08-11 DIAGNOSIS — R079 Chest pain, unspecified: Secondary | ICD-10-CM | POA: Diagnosis present

## 2016-08-11 LAB — BASIC METABOLIC PANEL
Anion gap: 5 (ref 5–15)
BUN: 13 mg/dL (ref 6–20)
CHLORIDE: 107 mmol/L (ref 101–111)
CO2: 27 mmol/L (ref 22–32)
CREATININE: 0.84 mg/dL (ref 0.44–1.00)
Calcium: 9.2 mg/dL (ref 8.9–10.3)
GFR calc Af Amer: >60 mL/min (ref >60)
GFR calc non Af Amer: >60 mL/min (ref >60)
GLUCOSE: 96 mg/dL (ref 65–99)
POTASSIUM: 3.8 mmol/L (ref 3.5–5.1)
SODIUM: 139 mmol/L (ref 135–145)

## 2016-08-11 LAB — CBC
HEMATOCRIT: 36.1 % (ref 35.0–47.0)
Hemoglobin: 11.7 g/dL — ABNORMAL LOW (ref 12.0–16.0)
MCH: 23.6 pg — AB (ref 26.0–34.0)
MCHC: 32.3 g/dL (ref 32.0–36.0)
MCV: 73.2 fL — AB (ref 80.0–100.0)
PLATELETS: 202 10*3/uL (ref 150–440)
RBC: 4.94 MIL/uL (ref 3.80–5.20)
RDW: 17.3 % — AB (ref 11.5–14.5)
WBC: 6.2 10*3/uL (ref 3.6–11.0)

## 2016-08-11 LAB — APTT: aPTT: 94 seconds — ABNORMAL HIGH (ref 24–36)

## 2016-08-11 LAB — TROPONIN I
Troponin I: <0.03 ng/mL (ref <0.03)
Troponin I: 0.22 ng/mL (ref <0.03)

## 2016-08-11 LAB — POCT PREGNANCY, URINE: PREG TEST UR: NEGATIVE

## 2016-08-11 LAB — PROTIME-INR
INR: 1.05
Prothrombin Time: 13.7 seconds (ref 11.4–15.2)

## 2016-08-11 MED ORDER — NITROGLYCERIN 2 % TD OINT
0.5000 [in_us] | TOPICAL_OINTMENT | Freq: Four times a day (QID) | TRANSDERMAL | Status: DC
  Administered 2016-08-12 – 2016-08-13 (×4): 0.5 [in_us] via TOPICAL
  Filled 2016-08-11 (×4): qty 1

## 2016-08-11 MED ORDER — HEPARIN (PORCINE) IN NACL 100-0.45 UNIT/ML-% IJ SOLN
1300.0000 [IU]/h | INTRAMUSCULAR | Status: DC
  Administered 2016-08-11: 1100 [IU]/h via INTRAVENOUS
  Administered 2016-08-12: 1300 [IU]/h via INTRAVENOUS
  Filled 2016-08-11 (×2): qty 250

## 2016-08-11 MED ORDER — KETOROLAC TROMETHAMINE 60 MG/2ML IM SOLN
30.0000 mg | Freq: Once | INTRAMUSCULAR | Status: AC
  Administered 2016-08-11: 30 mg via INTRAMUSCULAR
  Filled 2016-08-11: qty 2

## 2016-08-11 MED ORDER — HEPARIN BOLUS VIA INFUSION
4000.0000 [IU] | Freq: Once | INTRAVENOUS | Status: AC
  Administered 2016-08-11: 4000 [IU] via INTRAVENOUS
  Filled 2016-08-11: qty 4000

## 2016-08-11 MED ORDER — NITROGLYCERIN 2 % TD OINT
1.0000 [in_us] | TOPICAL_OINTMENT | Freq: Four times a day (QID) | TRANSDERMAL | Status: DC
  Administered 2016-08-11 – 2016-08-12 (×3): 1 [in_us] via TOPICAL
  Filled 2016-08-11: qty 1

## 2016-08-11 MED ORDER — KETOROLAC TROMETHAMINE 30 MG/ML IJ SOLN
30.0000 mg | Freq: Three times a day (TID) | INTRAMUSCULAR | Status: DC | PRN
Start: 2016-08-11 — End: 2016-08-13
  Administered 2016-08-12: 30 mg via INTRAVENOUS
  Filled 2016-08-11: qty 1

## 2016-08-11 MED ORDER — NITROGLYCERIN 2 % TD OINT
TOPICAL_OINTMENT | TRANSDERMAL | Status: AC
  Administered 2016-08-11: 1 [in_us] via TOPICAL
  Filled 2016-08-11: qty 1

## 2016-08-11 MED ORDER — IOPAMIDOL (ISOVUE-370) INJECTION 76%
100.0000 mL | Freq: Once | INTRAVENOUS | Status: AC | PRN
  Administered 2016-08-11: 100 mL via INTRAVENOUS
  Filled 2016-08-11: qty 100

## 2016-08-11 NOTE — H&P (Signed)
[facility] - [place] @ [facility] Admission History and Physical [employer], D.O.  ---------------------------------------------------------------------------------------------------------------------   PATIENT NAME: Kara Pearson MR#: [id number] DATE OF BIRTH: 1976/10/12 DATE OF ADMISSION: 08/11/2016 PRIMARY CARE PHYSICIAN: No PCP Per Patient  REQUESTING/REFERRING PHYSICIAN: ED Dr. Pershing Proud  CHIEF COMPLAINT: Chief Complaint  Patient presents with  . Chest Pain    HISTORY OF PRESENT ILLNESS: Kara Pearson is a 40 y.o. female with No known medical history was in a usual state of health until 3 days ago when she began experiencing left-sided chest pain which was intermittent, described as sharp, severe, localized to the left chest. She states that she would lay down to rest which did help improve her pain. She reports that she experienced 10 out of 10 squeezing chest pain this afternoon while pushing a lawnmower and states that her left arm became numb and tingly as well as discolored this afternoon which prompted her emergency department visit. She received aspirin and nitroglycerin which did not help the pain. At present the pain is about a 3 out of 10 and is again localized.   She denies any associated shortness of breath, lightheadedness, diaphoresis, nausea. She did not take any medication or seek any medical attention for her pain. Patient denies having any medical history, has never had a cardiac workup.  Otherwise there has been no change in status. Patient has been taking medication as prescribed and there has been no recent change in medication or diet.  There has been no recent illness, travel or sick contacts.    Patient denies fevers/chills, weakness, dizziness,  shortness of breath, N/V/C/D, abdominal pain, dysuria/frequency, changes in mental status.   EMS/ED COURSE:   Patient was started on a heparin drip and received aspirin.  PAST MEDICAL HISTORY: History  reviewed. No pertinent past medical history.  Patient denies hypertension, diabetes, hyperlipidemia, hypothyroidism.  PAST SURGICAL HISTORY: Past Surgical History:  Procedure Laterality Date  . TUBAL LIGATION        SOCIAL HISTORY: Social History  Substance Use Topics  . Smoking status: Never Smoker  . Smokeless tobacco: Never Used  . Alcohol use No   Patient denies tobacco, alcohol, and illicit drug use   FAMILY HISTORY: Patient reports that both parents are living and healthy. She denies any family history of heart disease, sudden cardiac death or stroke. She has 5 living children who are all healthy.   MEDICATIONS AT HOME: Per medication reconciliation patient is not taking any of the listed medications at present. Prior to Admission medications   Medication Sig Start Date End Date Taking? Authorizing Provider  diclofenac (VOLTAREN) 50 MG EC tablet Take 1 tablet (50 mg total) by mouth 2 (two) times daily. Patient not taking: Reported on 08/11/2016 07/19/16   Elson Areas, PA-C  methocarbamol (ROBAXIN) 500 MG tablet Take 1 tablet (500 mg total) by mouth 2 (two) times daily. Patient not taking: Reported on 08/11/2016 07/19/16   Elson Areas, PA-C  metroNIDAZOLE (FLAGYL) 500 MG tablet Take 1 tablet (500 mg total) by mouth 2 (two) times daily. Patient not taking: Reported on 08/11/2016 05/17/16   Donnetta Hutching, MD  oxyCODONE-acetaminophen (PERCOCET) 5-325 MG tablet Take 1-2 tablets by mouth every 4 (four) hours as needed. Patient not taking: Reported on 08/11/2016 05/17/16   Donnetta Hutching, MD    DRUG ALLERGIES: No Known Allergies   REVIEW OF SYSTEMS: CONSTITUTIONAL: No fever/chills, fatigue, weakness, weight gain/loss, headache EYES: No blurry or double vision. ENT: No tinnitus, postnasal drip, redness  or soreness of the oropharynx. RESPIRATORY: No cough, wheeze, hemoptysis, dyspnea. CARDIOVASCULAR: Positive chest pain. orthopnea, palpitations, syncope. GASTROINTESTINAL: No nausea,  vomiting, constipation, diarrhea, abdominal pain, hematemesis, melena or hematochezia. GENITOURINARY: No dysuria or hematuria. ENDOCRINE: No polyuria or nocturia. No heat or cold intolerance. HEMATOLOGY: No anemia, bruising, bleeding. INTEGUMENTARY: No rashes, ulcers, lesions. MUSCULOSKELETAL: No arthritis, swelling, gout. NEUROLOGIC: Positive numbness and tingling of the left arm, negative, weakness or ataxia. No seizure-type activity. PSYCHIATRIC: No anxiety, depression, insomnia.  PHYSICAL EXAMINATION: VITAL SIGNS: Blood pressure (!) 155/101, pulse 60, temperature 98.6 F (37 C), temperature source Oral, resp. rate 18, height 5\' 8"  (1.727 m), weight 106.6 kg (235 lb), last menstrual period 07/28/2016, SpO2 100 %.  GENERAL: 40 y.o.-year-old black female patient, well-developed, well-nourished lying in the bed in no acute distress.  Pleasant and cooperative.   HEENT: Head atraumatic, normocephalic. Pupils equal, round, reactive to light and accommodation. No scleral icterus. Extraocular muscles intact. Nares are patent. Oropharynx is clear. Mucus membranes moist. NECK: Supple, full range of motion. No JVD, no bruit heard. No thyroid enlargement, no tenderness, no cervical lymphadenopathy. CHEST: Normal breath sounds bilaterally. No wheezing, rales, rhonchi or crackles. No use of accessory muscles of respiration.  There is moderate to severe reproducible chest wall tenderness over the left chest wall as well as the left trapezius and neck. CARDIOVASCULAR: S1, S2 normal. No murmurs, rubs, or gallops. Cap refill <2 seconds. ABDOMEN: Soft, nontender, nondistended. No rebound, guarding, rigidity. Normoactive bowel sounds present in all four quadrants. No organomegaly or mass. EXTREMITIES: Full range of motion. No pedal edema, cyanosis, or clubbing. NEUROLOGIC: Cranial nerves II through XII are grossly intact with no focal sensorimotor deficit. Muscle strength 5/5 in all extremities. Sensation  intact. Gait not checked. PSYCHIATRIC: The patient is alert and oriented x 3. Normal affect, mood, thought content. SKIN: Warm, dry, and intact without obvious rash, lesion, or ulcer.  LABORATORY PANEL:  CBC  Recent Labs Lab 08/11/16 1701  WBC 6.2  HGB 11.7*  HCT 36.1  PLT 202   ----------------------------------------------------------------------------------------------------------------- Chemistries  Recent Labs Lab 08/11/16 1701  NA 139  K 3.8  CL 107  CO2 27  GLUCOSE 96  BUN 13  CREATININE 0.84  CALCIUM 9.2   ------------------------------------------------------------------------------------------------------------------ Cardiac Enzymes  Recent Labs Lab 08/11/16 1955  TROPONINI 0.22*   ------------------------------------------------------------------------------------------------------------------  RADIOLOGY: Dg Chest 2 View  Result Date: 08/11/2016 CLINICAL DATA:  Acute onset left-sided chest pain radiating to left arm today. EXAM: CHEST  2 VIEW COMPARISON:  01/27/2016 FINDINGS: The heart size and mediastinal contours are within normal limits. Both lungs are clear. The visualized skeletal structures are unremarkable. IMPRESSION: Negative.  No active cardiopulmonary disease. Electronically Signed   By: Myles RosenthalJohn  Stahl M.D.   On: 08/11/2016 18:34   Ct Angio Chest Aorta W And/or Wo Contrast  Result Date: 08/11/2016 CLINICAL DATA:  Left-sided chest pain, onset at 15:15 today. EXAM: CT ANGIOGRAPHY CHEST WITH CONTRAST TECHNIQUE: Multidetector CT imaging of the chest was performed using the standard protocol during bolus administration of intravenous contrast. Multiplanar CT image reconstructions and MIPs were obtained to evaluate the vascular anatomy. CONTRAST:  100 mL Isovue 370 intravenous COMPARISON:  None. FINDINGS: Cardiovascular: There is good opacification of the pulmonary arteries. There is no pulmonary embolism. The thoracic aorta is normal in caliber and  intact. Mediastinum/Nodes: No mediastinal or hilar adenopathy. Axillary regions are unremarkable. Lungs/Pleura: The lungs are clear. No pleural effusions. Central airways are patent. Upper Abdomen: No significant abnormality. Musculoskeletal: No significant  skeletal lesion Review of the MIP images confirms the above findings. IMPRESSION: Negative for acute pulmonary embolism.  No significant abnormality. Electronically Signed   By: Ellery Plunk M.D.   On: 08/11/2016 21:58    EKG: Normal sinus rhythm at 67 bpm with a normal axis and nonspecific ST and T wave changes. IMPRESSION AND PLAN:  This is a 40 y.o. female with no known past medical history now being admitted with: 1. Non-ST elevation MI-we'll admit for observation to the telemetry unit. Will start heparin, nitro paste, aspirin, beta blocker, statin, Plavix and O2 as needed. I will order lipids and TSH and request cardiology consultation from Dr. Welton Flakes who is aware of the case via the emergency. I also requested an echocardiogram given the atypical nature of the patient's presentation. 2. Musculoskeletal chest wall pain-IV Toradol for moderate severe pain.  Diet/Nutrition: Heart healthy Fluids: IV normal saline DVT Px: Heparin, SCDs and early ambulation Code Status: Full  All the records are reviewed and case discussed with ED provider. Management plans discussed with the patient and/or family who express understanding and agree with plan of care.   TOTAL TIME TAKING CARE OF THIS PATIENT: 60 minutes.   Kara Pearson D.O. on 08/11/2016 at 11:42 PM Between 7am to 6pm - Pager - [phone] After 6pm go to [email or web address] - Biomedical engineer [place] [facility] Office [phone] CC: Primary care physician; No PCP Per Patient     Note: This dictation was prepared with Dragon dictation along with smaller phrase technology. Any transcriptional errors that result from this process are unintentional.

## 2016-08-11 NOTE — ED Notes (Signed)
Pt returned from CT via stretcher.

## 2016-08-11 NOTE — ED Notes (Signed)
Pt transported to CT via stretcher.  

## 2016-08-11 NOTE — ED Notes (Signed)
Dr. Hugelmyer at bedside.  

## 2016-08-11 NOTE — ED Notes (Signed)
Pt ambulatory to restroom with no assist. 

## 2016-08-11 NOTE — ED Notes (Signed)
CT notified of negative urine preg.  
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
## 2016-08-11 NOTE — ED Triage Notes (Signed)
Pt reports left sided chest pain that started at 1515 today; pt arrived here via [facility]. EMS gave 1 nitro spray and 324mg  of ASA en route. Pt also reports left arm pain.

## 2016-08-11 NOTE — Progress Notes (Signed)
ANTICOAGULATION CONSULT NOTE - Initial Consult  Pharmacy Consult for heparin Indication: chest pain/ACS  No Known Allergies  Patient Measurements: Height: 5\' 8"  (172.7 cm) Weight: 235 lb (106.6 kg) IBW/kg (Calculated) : 63.9 Heparin Dosing Weight: 87.9 kg  Vital Signs: Temp: 98.6 F (37 C) (09/13 1653) Temp Source: Oral (09/13 1653) BP: 129/77 (09/13 2108) Pulse Rate: 78 (09/13 2108)  Labs:  Recent Labs  08/11/16 1701 08/11/16 1955  HGB 11.7*  --   HCT 36.1  --   PLT 202  --   CREATININE 0.84  --   TROPONINI <0.03 0.22*    Estimated Creatinine Clearance: 116.1 mL/min (by C-G formula based on SCr of 0.84 mg/dL).   Medical History: History reviewed. No pertinent past medical history.  Medications:  Infusions:  . heparin      Assessment: 40 yof cc CP that started after pushing a lawnmower at work. Troponin increased x 1. Pharmacy consulted to dose heparin for ACS.  Goal of Therapy:  Heparin level 0.3-0.7 units/ml Monitor platelets by anticoagulation protocol: Yes   Plan:  Give 4000 units bolus x 1 Start heparin infusion at 1100 units/hr Check anti-Xa level in 6 hours and daily while on heparin Continue to monitor H&H and platelets  Carola FrostNathan A Tony Granquist, Pharm.D., BCPS Clinical Pharmacist 08/11/2016,10:23 PM

## 2016-08-12 ENCOUNTER — Encounter
Admission: EM | Disposition: A | Discharge status: Home / Self Care | Payer: Self-pay | Source: Home / Self Care | Attending: Internal Medicine

## 2016-08-12 ENCOUNTER — Inpatient Hospital Stay
Admit: 2016-08-12 | Discharge: 2016-08-12 | Disposition: A | Discharge status: Home / Self Care | Payer: Medicaid Other | Attending: Cardiovascular Disease | Admitting: Cardiovascular Disease

## 2016-08-12 DIAGNOSIS — I214 Non-ST elevation (NSTEMI) myocardial infarction: Secondary | ICD-10-CM | POA: Diagnosis present

## 2016-08-12 DIAGNOSIS — R0789 Other chest pain: Secondary | ICD-10-CM | POA: Diagnosis present

## 2016-08-12 DIAGNOSIS — E669 Obesity, unspecified: Secondary | ICD-10-CM | POA: Diagnosis present

## 2016-08-12 DIAGNOSIS — K219 Gastro-esophageal reflux disease without esophagitis: Secondary | ICD-10-CM | POA: Diagnosis present

## 2016-08-12 DIAGNOSIS — Z6835 Body mass index (BMI) 35.0-35.9, adult: Secondary | ICD-10-CM | POA: Diagnosis not present

## 2016-08-12 DIAGNOSIS — R778 Other specified abnormalities of plasma proteins: Secondary | ICD-10-CM | POA: Diagnosis present

## 2016-08-12 LAB — TROPONIN I
Troponin I: 1.24 ng/mL (ref <0.03)
Troponin I: 0.7 ng/mL (ref <0.03)
Troponin I: 0.48 ng/mL (ref <0.03)

## 2016-08-12 LAB — LIPID PANEL
Cholesterol: 121 mg/dL (ref 0–200)
HDL: 38 mg/dL — ABNORMAL LOW (ref >40)
LDL CALC: 76 mg/dL (ref 0–99)
Total CHOL/HDL Ratio: 3.2 RATIO
Triglycerides: 35 mg/dL (ref <150)
VLDL: 7 mg/dL (ref 0–40)

## 2016-08-12 LAB — ECHOCARDIOGRAM COMPLETE
Height: 68 in
WEIGHTICAEL: 3779.2 [oz_av]

## 2016-08-12 LAB — HEPARIN LEVEL (UNFRACTIONATED)
HEPARIN UNFRACTIONATED: 0.34 [IU]/mL (ref 0.30–0.70)
HEPARIN UNFRACTIONATED: 0.29 [IU]/mL — AB (ref 0.30–0.70)
HEPARIN UNFRACTIONATED: 0.45 [IU]/mL (ref 0.30–0.70)

## 2016-08-12 LAB — TSH: TSH: 1.253 u[IU]/mL (ref 0.350–4.500)

## 2016-08-12 SURGERY — LEFT HEART CATH AND CORONARY ANGIOGRAPHY
Anesthesia: Moderate Sedation | Laterality: Right

## 2016-08-12 MED ORDER — SODIUM CHLORIDE 0.9% FLUSH
3.0000 mL | Freq: Two times a day (BID) | INTRAVENOUS | Status: DC
  Administered 2016-08-12: 3 mL via INTRAVENOUS

## 2016-08-12 MED ORDER — ONDANSETRON HCL 4 MG/2ML IJ SOLN
4.0000 mg | Freq: Four times a day (QID) | INTRAMUSCULAR | Status: DC | PRN
  Administered 2016-08-12: 4 mg via INTRAVENOUS
  Filled 2016-08-12: qty 2

## 2016-08-12 MED ORDER — SODIUM CHLORIDE 0.9 % WEIGHT BASED INFUSION: 3.0000 mL/kg/h | INTRAVENOUS | Status: AC

## 2016-08-12 MED ORDER — ASPIRIN 81 MG PO CHEW
81.0000 mg | CHEWABLE_TABLET | ORAL | Status: AC
  Administered 2016-08-13: 81 mg via ORAL
  Filled 2016-08-12: qty 1

## 2016-08-12 MED ORDER — SODIUM CHLORIDE 0.9 % IV SOLN
INTRAVENOUS | Status: DC
  Administered 2016-08-12: 01:00:00 via INTRAVENOUS

## 2016-08-12 MED ORDER — ACETAMINOPHEN 325 MG PO TABS
650.0000 mg | ORAL_TABLET | ORAL | Status: DC | PRN
  Administered 2016-08-12 – 2016-08-13 (×3): 650 mg via ORAL
  Filled 2016-08-12 (×3): qty 2

## 2016-08-12 MED ORDER — CLOPIDOGREL BISULFATE 75 MG PO TABS
300.0000 mg | ORAL_TABLET | Freq: Once | ORAL | Status: AC
  Administered 2016-08-12: 300 mg via ORAL
  Filled 2016-08-12: qty 4

## 2016-08-12 MED ORDER — SODIUM CHLORIDE 0.9 % IV SOLN: 250.0000 mL | INTRAVENOUS | Status: DC | PRN

## 2016-08-12 MED ORDER — SODIUM CHLORIDE 0.9% FLUSH: 3.0000 mL | INTRAVENOUS | Status: DC | PRN

## 2016-08-12 MED ORDER — ALPRAZOLAM 0.25 MG PO TABS: 0.2500 mg | ORAL_TABLET | Freq: Two times a day (BID) | ORAL | Status: DC | PRN

## 2016-08-12 MED ORDER — METOPROLOL TARTRATE 25 MG PO TABS
25.0000 mg | ORAL_TABLET | Freq: Two times a day (BID) | ORAL | Status: DC
  Administered 2016-08-12 – 2016-08-13 (×4): 25 mg via ORAL
  Filled 2016-08-12 (×4): qty 1

## 2016-08-12 MED ORDER — HEPARIN BOLUS VIA INFUSION
1300.0000 [IU] | Freq: Once | INTRAVENOUS | Status: AC
  Administered 2016-08-12: 1300 [IU] via INTRAVENOUS
  Filled 2016-08-12: qty 1300

## 2016-08-12 MED ORDER — ASPIRIN EC 81 MG PO TBEC
81.0000 mg | DELAYED_RELEASE_TABLET | Freq: Every day | ORAL | Status: DC
  Administered 2016-08-12 – 2016-08-13 (×2): 81 mg via ORAL
  Filled 2016-08-12 (×2): qty 1

## 2016-08-12 MED ORDER — ZOLPIDEM TARTRATE 5 MG PO TABS: 5.0000 mg | ORAL_TABLET | Freq: Every evening | ORAL | Status: DC | PRN

## 2016-08-12 MED ORDER — GI COCKTAIL ~~LOC~~: 30.0000 mL | Freq: Four times a day (QID) | ORAL | Status: DC | PRN

## 2016-08-12 MED ORDER — ATORVASTATIN CALCIUM 20 MG PO TABS
80.0000 mg | ORAL_TABLET | Freq: Once | ORAL | Status: AC
  Administered 2016-08-12: 80 mg via ORAL
  Filled 2016-08-12: qty 4

## 2016-08-12 MED ORDER — MORPHINE SULFATE (PF) 2 MG/ML IV SOLN: 2.0000 mg | INTRAVENOUS | Status: DC | PRN

## 2016-08-12 MED ORDER — SODIUM CHLORIDE 0.9 % WEIGHT BASED INFUSION: 1.0000 mL/kg/h | INTRAVENOUS | Status: DC

## 2016-08-12 NOTE — Progress Notes (Signed)
ANTICOAGULATION CONSULT NOTE -Follow up Consult  Pharmacy Consult for heparin Indication: chest pain/ACS  No Known Allergies  Patient Measurements: Height: 5\' 8"  (172.7 cm) Weight: 235 lb (106.6 kg) IBW/kg (Calculated) : 63.9 Heparin Dosing Weight: 87.9 kg  Vital Signs: Temp: 98.4 F (36.9 C) (09/14 0525) Temp Source: Oral (09/14 0525) BP: 126/71 (09/14 0656) Pulse Rate: 68 (09/14 0656)  Labs:  Recent Labs  08/11/16 1701 08/11/16 1955 08/11/16 2231 08/12/16 0320 08/12/16 0503  HGB 11.7*  --   --   --   --   HCT 36.1  --   --   --   --   PLT 202  --   --   --   --   APTT  --   --  94*  --   --   LABPROT  --   --  13.7  --   --   INR  --   --  1.05  --   --   HEPARINUNFRC  --   --   --   --  0.34  CREATININE 0.84  --   --   --   --   TROPONINI <0.03 0.22*  --  1.24*  --     Estimated Creatinine Clearance: 116.1 mL/min (by C-G formula based on SCr of 0.84 mg/dL).   Medical History: History reviewed. No pertinent past medical history.  Medications:  Infusions:  . sodium chloride 75 mL/hr at 08/12/16 0122  . heparin 1,100 Units/hr (08/11/16 2328)    Assessment: Kara yof cc CP that started after pushing a lawnmower at work. Troponin increased x 1. Pharmacy consulted to dose heparin for ACS.  Goal of Therapy:  Heparin level 0.3-0.7 units/ml Monitor platelets by anticoagulation protocol: Yes   Plan:  Give 4000 units bolus x 1 Start heparin infusion at 1100 units/hr Check anti-Xa level in 6 hours and daily while on heparin Continue to monitor H&H and platelets   9/14: HL @ 0533= 0.34. Will continue current rate and check confirmatory level in 6 hrs at 1130.   Cheryn Lundquist A, Pharm.D., BCPS Clinical Pharmacist 08/12/2016,8:00 AM

## 2016-08-12 NOTE — Progress Notes (Addendum)
[facility] - [place] [facility]   PATIENT NAME: Kara Pearson    MR#:  [id number]  DATE OF BIRTH:  04-08-1976  SUBJECTIVE:   Patient presented with chest pain which radiated to left arm associated with numbness. No chest pain overnight  REVIEW OF SYSTEMS:    Review of Systems  Constitutional: Negative.  Negative for chills, fever and malaise/fatigue.  HENT: Negative.  Negative for ear discharge, ear pain, hearing loss, nosebleeds and sore throat.   Eyes: Negative.  Negative for blurred vision and pain.  Respiratory: Negative.  Negative for cough, hemoptysis, shortness of breath and wheezing.   Cardiovascular: Negative.  Negative for chest pain, palpitations and leg swelling.  Gastrointestinal: Negative.  Negative for abdominal pain, blood in stool, diarrhea, nausea and vomiting.  Genitourinary: Negative.  Negative for dysuria.  Musculoskeletal: Negative.  Negative for back pain.  Skin: Negative.   Neurological: Negative for dizziness, tremors, speech change, focal weakness, seizures and headaches.  Endo/Heme/Allergies: Negative.  Does not bruise/bleed easily.  Psychiatric/Behavioral: Negative.  Negative for depression, hallucinations and suicidal ideas.    Tolerating Diet:Nothing by mouth      DRUG ALLERGIES:  No Known Allergies  VITALS:  Blood pressure 109/65, pulse 66, temperature 98.3 F (36.8 C), temperature source Oral, resp. rate 18, height 5\' 8"  (1.727 m), weight 107.1 kg (236 lb 3.2 oz), last menstrual period 07/28/2016, SpO2 97 %.  PHYSICAL EXAMINATION:   Physical Exam  Constitutional: She is oriented to person, place, and time and well-developed, well-nourished, and in no distress. No distress.  HENT:  Head: Normocephalic.  Eyes: No scleral icterus.  Neck: Normal range of motion. Neck supple. No JVD present. No tracheal deviation present.  Cardiovascular: Normal rate, regular rhythm and normal heart sounds.  Exam reveals no gallop and no  friction rub.   No murmur heard. Pulmonary/Chest: Effort normal and breath sounds normal. No respiratory distress. She has no wheezes. She has no rales. She exhibits no tenderness.  Abdominal: Soft. Bowel sounds are normal. She exhibits no distension and no mass. There is no tenderness. There is no rebound and no guarding.  Musculoskeletal: Normal range of motion. She exhibits no edema.  Neurological: She is alert and oriented to person, place, and time.  Skin: Skin is warm. No rash noted. No erythema.  Psychiatric: Affect and judgment normal.      LABORATORY PANEL:   CBC  Recent Labs Lab 08/11/16 1701  WBC 6.2  HGB 11.7*  HCT 36.1  PLT 202   ------------------------------------------------------------------------------------------------------------------  Chemistries   Recent Labs Lab 08/11/16 1701  NA 139  K 3.8  CL 107  CO2 27  GLUCOSE 96  BUN 13  CREATININE 0.84  CALCIUM 9.2   ------------------------------------------------------------------------------------------------------------------  Cardiac Enzymes  Recent Labs Lab 08/11/16 1955 08/12/16 0320 08/12/16 0913  TROPONINI 0.22* 1.24* 0.70*   ------------------------------------------------------------------------------------------------------------------  RADIOLOGY:  Dg Chest 2 View  Result Date: 08/11/2016 CLINICAL DATA:  Acute onset left-sided chest pain radiating to left arm today. EXAM: CHEST  2 VIEW COMPARISON:  01/27/2016 FINDINGS: The heart size and mediastinal contours are within normal limits. Both lungs are clear. The visualized skeletal structures are unremarkable. IMPRESSION: Negative.  No active cardiopulmonary disease. Electronically Signed   By: Myles RosenthalJohn  Stahl M.D.   On: 08/11/2016 18:34   Ct Angio Chest Aorta W And/or Wo Contrast  Result Date: 08/11/2016 CLINICAL DATA:  Left-sided chest pain, onset at 15:15 today. EXAM: CT ANGIOGRAPHY CHEST WITH CONTRAST TECHNIQUE: Multidetector CT  imaging  of the chest was performed using the standard protocol during bolus administration of intravenous contrast. Multiplanar CT image reconstructions and MIPs were obtained to evaluate the vascular anatomy. CONTRAST:  100 mL Isovue 370 intravenous COMPARISON:  None. FINDINGS: Cardiovascular: There is good opacification of the pulmonary arteries. There is no pulmonary embolism. The thoracic aorta is normal in caliber and intact. Mediastinum/Nodes: No mediastinal or hilar adenopathy. Axillary regions are unremarkable. Lungs/Pleura: The lungs are clear. No pleural effusions. Central airways are patent. Upper Abdomen: No significant abnormality. Musculoskeletal: No significant skeletal lesion Review of the MIP images confirms the above findings. IMPRESSION: Negative for acute pulmonary embolism.  No significant abnormality. Electronically Signed   By: Ellery Plunk M.D.   On: 08/11/2016 21:58     ASSESSMENT AND PLAN:   40 year old female with obesity who presents with chest pain and found to have non-STEMI.   1. Non-STEMI: Plan for cardiac catheterization today. Follow up on echo cardiac exam. Continue heparin drip, aspirin, atorvastatin and metoprolol. LDL 76. Troponin max 1.24 Now 0.70 CT chest on admission negative for PE. Management plans discussed with the patient and she is in agreement. Discussed with Dr. Lewayne Bunting with nursing staff  CODE STATUS: Full  TOTAL TIME TAKING CARE OF THIS PATIENT: 33 minutes.     POSSIBLE D/C tomorrow, DEPENDING ON CLINICAL CONDITION.   Christoher Drudge M.D on 08/12/2016 at 11:39 AM  Between 7am to 6pm - Pager - [phone] After 6pm go to [email or web address] - Social research officer, government  [facility]  Office  [phone]  CC: Primary care physician; No PCP Per Patient  Note: This dictation was prepared with Dragon dictation along with smaller phrase technology. Any transcriptional errors that result from this process are unintentional.

## 2016-08-12 NOTE — Care Management (Signed)
Patient admitted with nstemi with second troponin of 1.12.  On heparin drip and cardiac cath pending.  Patient is without insurance but is employed. Independent in all adls, denies issues  with transportation.  She is due to go down for her cath any minute so CM will speak with patient again after cardiac cath.

## 2016-08-12 NOTE — Progress Notes (Signed)
MD Pyreddy aware of new troponin level of 1.24 per MD we will continue to monitor.

## 2016-08-12 NOTE — Progress Notes (Addendum)
ANTICOAGULATION CONSULT NOTE -Follow up Consult  Pharmacy Consult for heparin Indication: chest pain/ACS  No Known Allergies  Patient Measurements: Height: 5\' 8"  (172.7 cm) Weight: 236 lb 3.2 oz (107.1 kg) IBW/kg (Calculated) : 63.9 Heparin Dosing Weight: 87.9 kg  Vital Signs: Temp: 98.3 F (36.8 C) (09/14 1113) Temp Source: Oral (09/14 1113) BP: 109/65 (09/14 1113) Pulse Rate: 66 (09/14 1113)  Labs:  Recent Labs  08/11/16 1701  08/11/16 2231 08/12/16 0320 08/12/16 0503 08/12/16 0913 08/12/16 1257  HGB 11.7*  --   --   --   --   --   --   HCT 36.1  --   --   --   --   --   --   PLT 202  --   --   --   --   --   --   APTT  --   --  94*  --   --   --   --   LABPROT  --   --  13.7  --   --   --   --   INR  --   --  1.05  --   --   --   --   HEPARINUNFRC  --   --   --   --  0.34  --  0.29*  CREATININE 0.84  --   --   --   --   --   --   TROPONINI <0.03  < >  --  1.24*  --  0.70* 0.48*  < > = values in this interval not displayed.  Estimated Creatinine Clearance: 116.4 mL/min (by C-G formula based on SCr of 0.84 mg/dL).   Medical History: History reviewed. No pertinent past medical history.  Medications:  Infusions:  . sodium chloride 75 mL/hr at 08/12/16 0122  . [START ON 08/13/2016] sodium chloride     Followed by  . [START ON 08/13/2016] sodium chloride    . heparin 1,100 Units/hr (08/11/16 2328)    Assessment: 40 yof cc CP that started after pushing a lawnmower at work. Troponin increased x 1. Pharmacy consulted to dose heparin for ACS.  Goal of Therapy:  Heparin level 0.3-0.7 units/ml Monitor platelets by anticoagulation protocol: Yes   Plan:  Give 4000 units bolus x 1 Start heparin infusion at 1100 units/hr Check anti-Xa level in 6 hours and daily while on heparin Continue to monitor H&H and platelets   9/14: HL @ 0533= 0.34. Will continue current rate and check confirmatory level in 6 hrs at 1130.  9/14: HL @ 1257= 0.29. Will give Bolus of 1300  units x 1 and increase drip to 1300 units/hr. REcheck in 6 hrs at 2000.    Usher Hedberg A, Pharm.D., BCPS Clinical Pharmacist 08/12/2016,1:47 PM

## 2016-08-12 NOTE — Progress Notes (Signed)
Kara Pearson is a 40 y.o. female  [id number]  Primary Cardiologist: Adrian Blackwater Reason for Consultation: Chest pain and non-STEMI  HPI: This is a 39 year old African-American female with a past medical history ofsignificant medical problems presented to the hospital with 2-3 days of pressure type chest pain associated with shortness of breath. Last night she had excruciating 10 over 10 chest pain lasting for more than an hour thus she came to the hospital.   Review of Systems: No orthopnea PND or leg swelling.   History reviewed. No pertinent past medical history.  Medications Prior to Admission  Medication Sig Dispense Refill  . diclofenac (VOLTAREN) 50 MG EC tablet Take 1 tablet (50 mg total) by mouth 2 (two) times daily. (Patient not taking: Reported on 08/11/2016) 20 tablet 0  . methocarbamol (ROBAXIN) 500 MG tablet Take 1 tablet (500 mg total) by mouth 2 (two) times daily. (Patient not taking: Reported on 08/11/2016) 20 tablet 0  . metroNIDAZOLE (FLAGYL) 500 MG tablet Take 1 tablet (500 mg total) by mouth 2 (two) times daily. (Patient not taking: Reported on 08/11/2016) 14 tablet 0  . oxyCODONE-acetaminophen (PERCOCET) 5-325 MG tablet Take 1-2 tablets by mouth every 4 (four) hours as needed. (Patient not taking: Reported on 08/11/2016) 15 tablet 0     . [START ON 08/13/2016] aspirin  81 mg Oral Pre-Cath  . aspirin EC  81 mg Oral Daily  . metoprolol tartrate  25 mg Oral BID  . nitroGLYCERIN  0.5 inch Topical Q6H  . nitroGLYCERIN  1 inch Topical Q6H  . sodium chloride flush  3 mL Intravenous Q12H    Infusions: . sodium chloride 75 mL/hr at 08/12/16 0122  . [START ON 08/13/2016] sodium chloride     Followed by  . [START ON 08/13/2016] sodium chloride    . heparin 1,100 Units/hr (08/11/16 2328)    No Known Allergies  Social History   Social History  . Marital status: Single    Spouse name: N/A  . Number of children: N/A  . Years of education: N/A   Occupational  History  . Not on file.   Social History Main Topics  . Smoking status: Never Smoker  . Smokeless tobacco: Never Used  . Alcohol use No  . Drug use: No  . Sexual activity: Not on file   Other Topics Concern  . Not on file   Social History Narrative  . No narrative on file    History reviewed. No pertinent family history.  PHYSICAL EXAM: Vitals:   08/12/16 0656 08/12/16 0745  BP: 126/71 140/83  Pulse: 68 70  Resp:  20  Temp:       Intake/Output Summary (Last 24 hours) at 08/12/16 0918 Last data filed at 08/12/16 0300  Gross per 24 hour  Intake            122.5 ml  Output                0 ml  Net            122.5 ml    General:  Well appearing. No respiratory difficulty HEENT: normal Neck: supple. no JVD. Carotids 2+ bilat; no bruits. No lymphadenopathy or thryomegaly appreciated. Cor: PMI nondisplaced. Regular rate & rhythm. No rubs, gallops or murmurs. Lungs: clear Abdomen: soft, nontender, nondistended. No hepatosplenomegaly. No bruits or masses. Good bowel sounds. Extremities: no cyanosis, clubbing, rash, edema Neuro: alert & oriented x 3, cranial nerves grossly intact. moves all  4 extremities w/o difficulty. Affect pleasant.  AVW:UJWJXB sinus rhythm no acute changes  Results for orders placed or performed during the hospital encounter of 08/11/16 (from the past 24 hour(s))  Basic metabolic panel     Status: None   Collection Time: 08/11/16  5:01 PM  Result Value Ref Range   Sodium 139 135 - 145 mmol/L   Potassium 3.8 3.5 - 5.1 mmol/L   Chloride 107 101 - 111 mmol/L   CO2 27 22 - 32 mmol/L   Glucose, Bld 96 65 - 99 mg/dL   BUN 13 6 - 20 mg/dL   Creatinine, Ser 1.47 0.44 - 1.00 mg/dL   Calcium 9.2 8.9 - 82.9 mg/dL   GFR calc non Af Amer >60 >60 mL/min   GFR calc Af Amer >60 >60 mL/min   Anion gap 5 5 - 15  CBC     Status: Abnormal   Collection Time: 08/11/16  5:01 PM  Result Value Ref Range   WBC 6.2 3.6 - 11.0 K/uL   RBC 4.94 3.80 - 5.20 MIL/uL    Hemoglobin 11.7 (L) 12.0 - 16.0 g/dL   HCT 56.2 13.0 - 86.5 %   MCV 73.2 (L) 80.0 - 100.0 fL   MCH 23.6 (L) 26.0 - 34.0 pg   MCHC 32.3 32.0 - 36.0 g/dL   RDW 78.4 (H) 69.6 - 29.5 %   Platelets 202 150 - 440 K/uL  Troponin I     Status: None   Collection Time: 08/11/16  5:01 PM  Result Value Ref Range   Troponin I <0.03 <0.03 ng/mL  Troponin I     Status: Abnormal   Collection Time: 08/11/16  7:55 PM  Result Value Ref Range   Troponin I 0.22 (HH) <0.03 ng/mL  Pregnancy, urine POC     Status: None   Collection Time: 08/11/16  9:25 PM  Result Value Ref Range   Preg Test, Ur NEGATIVE NEGATIVE  APTT     Status: Abnormal   Collection Time: 08/11/16 10:31 PM  Result Value Ref Range   aPTT 94 (H) 24 - 36 seconds  Protime-INR     Status: None   Collection Time: 08/11/16 10:31 PM  Result Value Ref Range   Prothrombin Time 13.7 11.4 - 15.2 seconds   INR 1.05   Troponin I-serum (0, 3, 6 hours)     Status: Abnormal   Collection Time: 08/12/16  3:20 AM  Result Value Ref Range   Troponin I 1.24 (HH) <0.03 ng/mL  TSH     Status: None   Collection Time: 08/12/16  3:20 AM  Result Value Ref Range   TSH 1.253 0.350 - 4.500 uIU/mL  Heparin level (unfractionated)     Status: None   Collection Time: 08/12/16  5:03 AM  Result Value Ref Range   Heparin Unfractionated 0.34 0.30 - 0.70 IU/mL  Lipid panel     Status: Abnormal   Collection Time: 08/12/16  5:03 AM  Result Value Ref Range   Cholesterol 121 0 - 200 mg/dL   Triglycerides 35 <284 mg/dL   HDL 38 (L) >13 mg/dL   Total CHOL/HDL Ratio 3.2 RATIO   VLDL 7 0 - 40 mg/dL   LDL Cholesterol 76 0 - 99 mg/dL   Dg Chest 2 View  Result Date: 08/11/2016 CLINICAL DATA:  Acute onset left-sided chest pain radiating to left arm today. EXAM: CHEST  2 VIEW COMPARISON:  01/27/2016 FINDINGS: The heart size and mediastinal contours are within normal  limits. Both lungs are clear. The visualized skeletal structures are unremarkable. IMPRESSION: Negative.  No  active cardiopulmonary disease. Electronically Signed   By: Myles RosenthalJohn  Stahl M.D.   On: 08/11/2016 18:34   Ct Angio Chest Aorta W And/or Wo Contrast  Result Date: 08/11/2016 CLINICAL DATA:  Left-sided chest pain, onset at 15:15 today. EXAM: CT ANGIOGRAPHY CHEST WITH CONTRAST TECHNIQUE: Multidetector CT imaging of the chest was performed using the standard protocol during bolus administration of intravenous contrast. Multiplanar CT image reconstructions and MIPs were obtained to evaluate the vascular anatomy. CONTRAST:  100 mL Isovue 370 intravenous COMPARISON:  None. FINDINGS: Cardiovascular: There is good opacification of the pulmonary arteries. There is no pulmonary embolism. The thoracic aorta is normal in caliber and intact. Mediastinum/Nodes: No mediastinal or hilar adenopathy. Axillary regions are unremarkable. Lungs/Pleura: The lungs are clear. No pleural effusions. Central airways are patent. Upper Abdomen: No significant abnormality. Musculoskeletal: No significant skeletal lesion Review of the MIP images confirms the above findings. IMPRESSION: Negative for acute pulmonary embolism.  No significant abnormality. Electronically Signed   By: Ellery Plunkaniel R Mitchell M.D.   On: 08/11/2016 21:58     ASSESSMENT AND PLAN: Non-STEMI with elevated troponin rising but chest pain is better with aspirin and heparin and nitrates. Patient was explained risk and benefits and was scheduled for cardiac catheterization today.  Jakarius Flamenco A

## 2016-08-12 NOTE — Progress Notes (Signed)
ANTICOAGULATION CONSULT NOTE -Follow up Consult  Pharmacy Consult for heparin Indication: chest pain/ACS  No Known Allergies  Patient Measurements: Height: 5\' 8"  (172.7 cm) Weight: 236 lb 3.2 oz (107.1 kg) IBW/kg (Calculated) : 63.9 Heparin Dosing Weight: 87.9 kg  Vital Signs: Temp: 98.5 F (36.9 C) (09/14 2037) Temp Source: Oral (09/14 2037) BP: 118/86 (09/14 2037) Pulse Rate: 69 (09/14 2037)  Labs:  Recent Labs  08/11/16 1701  08/11/16 2231 08/12/16 0320 08/12/16 0503 08/12/16 0913 08/12/16 1257 08/12/16 2025  HGB 11.7*  --   --   --   --   --   --   --   HCT 36.1  --   --   --   --   --   --   --   PLT 202  --   --   --   --   --   --   --   APTT  --   --  94*  --   --   --   --   --   LABPROT  --   --  13.7  --   --   --   --   --   INR  --   --  1.05  --   --   --   --   --   HEPARINUNFRC  --   --   --   --  0.34  --  0.29* 0.45  CREATININE 0.84  --   --   --   --   --   --   --   TROPONINI <0.03  < >  --  1.24*  --  0.70* 0.48*  --   < > = values in this interval not displayed.  Estimated Creatinine Clearance: 116.4 mL/min (by C-G formula based on SCr of 0.84 mg/dL).   Medical History: History reviewed. No pertinent past medical history.  Medications:  Infusions:  . sodium chloride 75 mL/hr at 08/12/16 0122  . [START ON 08/13/2016] sodium chloride     Followed by  . [START ON 08/13/2016] sodium chloride    . heparin 1,300 Units/hr (08/12/16 1706)    Assessment: 40 yof cc CP that started after pushing a lawnmower at work. Troponin increased x 1. Pharmacy consulted to dose heparin for ACS.  Goal of Therapy:  Heparin level 0.3-0.7 units/ml Monitor platelets by anticoagulation protocol: Yes   Plan:  Give 4000 units bolus x 1 Start heparin infusion at 1100 units/hr Check anti-Xa level in 6 hours and daily while on heparin Continue to monitor H&H and platelets   9/14: HL @ 0533= 0.34. Will continue current rate and check confirmatory level in 6 hrs  at 1130.  9/14: HL @ 1257= 0.29. Will give Bolus of 1300 units x 1 and increase drip to 1300 units/hr. REcheck in 6 hrs at 2000.  9/14: HL @ 2025 = 0.45.  Will continue this pt on current rate of 1300 units/hr and recheck HL on 9/15 @ 0230.   Marshal Schrecengost D, Pharm.D. Clinical Pharmacist 08/12/2016,9:16 PM

## 2016-08-12 NOTE — Progress Notes (Signed)
*  PRELIMINARY RESULTS* Echocardiogram 2D Echocardiogram has been performed.  Cristela BlueHege, Franco Duley 08/12/2016, 3:04 PM

## 2016-08-12 NOTE — ED Notes (Signed)
Report given to floor RN. Pt taken to floor via stretcher. Vital signs stable prior to transport.  

## 2016-08-12 NOTE — Progress Notes (Signed)
RN called by Cath lab...reported that pts cath will not be performed until tomorrow am/ orders to allow pt to eat and make NPO after midnight/ informed pt.

## 2016-08-13 ENCOUNTER — Encounter
Admission: EM | Disposition: A | Discharge status: Home / Self Care | Payer: Self-pay | Source: Home / Self Care | Attending: Internal Medicine

## 2016-08-13 ENCOUNTER — Encounter: Payer: Self-pay | Admitting: Cardiovascular Disease

## 2016-08-13 HISTORY — PX: CARDIAC CATHETERIZATION: SHX172

## 2016-08-13 LAB — CBC
HEMATOCRIT: 29.2 % — AB (ref 35.0–47.0)
Hemoglobin: 9.5 g/dL — ABNORMAL LOW (ref 12.0–16.0)
MCH: 23.4 pg — AB (ref 26.0–34.0)
MCHC: 32.4 g/dL (ref 32.0–36.0)
MCV: 72.3 fL — AB (ref 80.0–100.0)
PLATELETS: 152 10*3/uL (ref 150–440)
RBC: 4.05 MIL/uL (ref 3.80–5.20)
RDW: 17.3 % — AB (ref 11.5–14.5)
WBC: 6 10*3/uL (ref 3.6–11.0)

## 2016-08-13 LAB — HEPARIN LEVEL (UNFRACTIONATED): Heparin Unfractionated: 0.46 IU/mL (ref 0.30–0.70)

## 2016-08-13 SURGERY — LEFT HEART CATH AND CORONARY ANGIOGRAPHY
Anesthesia: Moderate Sedation

## 2016-08-13 MED ORDER — IOPAMIDOL (ISOVUE-300) INJECTION 61%
INTRAVENOUS | Status: DC | PRN
  Administered 2016-08-13: 90 mL via INTRAVENOUS

## 2016-08-13 MED ORDER — FENTANYL CITRATE (PF) 100 MCG/2ML IJ SOLN
INTRAMUSCULAR | Status: DC | PRN
  Administered 2016-08-13 (×2): 25 ug via INTRAVENOUS
  Administered 2016-08-13: 50 ug via INTRAVENOUS

## 2016-08-13 MED ORDER — HEPARIN (PORCINE) IN NACL 2-0.9 UNIT/ML-% IJ SOLN
INTRAMUSCULAR | Status: AC
  Filled 2016-08-13: qty 500

## 2016-08-13 MED ORDER — PANTOPRAZOLE SODIUM 40 MG PO TBEC: 40.0000 mg | DELAYED_RELEASE_TABLET | Freq: Every day | ORAL | 0 refills | Status: DC

## 2016-08-13 MED ORDER — SODIUM CHLORIDE 0.9% FLUSH: 3.0000 mL | Freq: Two times a day (BID) | INTRAVENOUS | Status: DC

## 2016-08-13 MED ORDER — MIDAZOLAM HCL 2 MG/2ML IJ SOLN
INTRAMUSCULAR | Status: DC | PRN
  Administered 2016-08-13: 2 mg via INTRAVENOUS
  Administered 2016-08-13: 1 mg via INTRAVENOUS

## 2016-08-13 MED ORDER — ACETAMINOPHEN 325 MG PO TABS: 650.0000 mg | ORAL_TABLET | ORAL | Status: DC | PRN

## 2016-08-13 MED ORDER — SODIUM CHLORIDE 0.9 % WEIGHT BASED INFUSION: 1.0000 mL/kg/h | INTRAVENOUS | Status: AC

## 2016-08-13 MED ORDER — SODIUM CHLORIDE 0.9 % IV SOLN: 250.0000 mL | INTRAVENOUS | Status: DC | PRN

## 2016-08-13 MED ORDER — SODIUM CHLORIDE 0.9% FLUSH: 3.0000 mL | INTRAVENOUS | Status: DC | PRN

## 2016-08-13 MED ORDER — ONDANSETRON HCL 4 MG/2ML IJ SOLN: 4.0000 mg | Freq: Four times a day (QID) | INTRAMUSCULAR | Status: DC | PRN

## 2016-08-13 MED ORDER — MIDAZOLAM HCL 2 MG/2ML IJ SOLN
INTRAMUSCULAR | Status: AC
  Filled 2016-08-13: qty 2

## 2016-08-13 MED ORDER — FENTANYL CITRATE (PF) 100 MCG/2ML IJ SOLN
INTRAMUSCULAR | Status: AC
  Filled 2016-08-13: qty 2

## 2016-08-13 SURGICAL SUPPLY — 10 items
CATH INFINITI 5 FR 3DRC (CATHETERS) ×3 IMPLANT
CATH INFINITI 5FR ANG PIGTAIL (CATHETERS) ×3 IMPLANT
CATH INFINITI 5FR JL4 (CATHETERS) ×3 IMPLANT
CATH INFINITI JR4 5F (CATHETERS) ×3 IMPLANT
DEVICE CLOSURE MYNXGRIP 5F (Vascular Products) ×3 IMPLANT
KIT MANI 3VAL PERCEP (MISCELLANEOUS) ×3 IMPLANT
NEEDLE PERC 18GX7CM (NEEDLE) ×3 IMPLANT
PACK CARDIAC CATH (CUSTOM PROCEDURE TRAY) ×3 IMPLANT
SHEATH PINNACLE 5F 10CM (SHEATH) ×3 IMPLANT
WIRE EMERALD 3MM-J .035X150CM (WIRE) ×3 IMPLANT

## 2016-08-13 NOTE — Progress Notes (Signed)
SUBJECTIVE: Patient denies any chest pain   Vitals:   08/12/16 1113 08/12/16 2037 08/13/16 0700 08/13/16 0756  BP: 109/65 118/86 132/78 (!) 134/99  Pulse: 66 69 67 68  Resp: 18 15 15 17   Temp: 98.3 F (36.8 C) 98.5 F (36.9 C)    TempSrc: Oral Oral    SpO2: 97% 100% 97% 95%  Weight:      Height:        Intake/Output Summary (Last 24 hours) at 08/13/16 0810 Last data filed at 08/13/16 0700  Gross per 24 hour  Intake           381.23 ml  Output                0 ml  Net           381.23 ml    LABS: Basic Metabolic Panel:  Recent Labs  16/08/9608/13/17 1701  NA 139  K 3.8  CL 107  CO2 27  GLUCOSE 96  BUN 13  CREATININE 0.84  CALCIUM 9.2   Liver Function Tests: No results for input(s): AST, ALT, ALKPHOS, BILITOT, PROT, ALBUMIN in the last 72 hours. No results for input(s): LIPASE, AMYLASE in the last 72 hours. CBC:  Recent Labs  08/11/16 1701 08/13/16 0251  WBC 6.2 6.0  HGB 11.7* 9.5*  HCT 36.1 29.2*  MCV 73.2* 72.3*  PLT 202 152   Cardiac Enzymes:  Recent Labs  08/12/16 0320 08/12/16 0913 08/12/16 1257  TROPONINI 1.24* 0.70* 0.48*   BNP: Invalid input(s): POCBNP D-Dimer: No results for input(s): DDIMER in the last 72 hours. Hemoglobin A1C: No results for input(s): HGBA1C in the last 72 hours. Fasting Lipid Panel:  Recent Labs  08/12/16 0503  CHOL 121  HDL 38*  LDLCALC 76  TRIG 35  CHOLHDL 3.2   Thyroid Function Tests:  Recent Labs  08/12/16 0320  TSH 1.253   Anemia Panel: No results for input(s): VITAMINB12, FOLATE, FERRITIN, TIBC, IRON, RETICCTPCT in the last 72 hours.   PHYSICAL EXAM General: Well developed, well nourished, in no acute distress HEENT:  Normocephalic and atramatic Neck:  No JVD.  Lungs: Clear bilaterally to auscultation and percussion. Heart: HRRR . Normal S1 and S2 without gallops or murmurs.  Abdomen: Bowel sounds are positive, abdomen soft and non-tender  Msk:  Back normal, normal gait. Normal strength and  tone for age. Extremities: No clubbing, cyanosis or edema.   Neuro: Alert and oriented X 3. Psych:  Good affect, responds appropriately  TELEMETRY:Sinus rhythm  ASSESSMENT AND PLAN: Normal coronaries with normal left reticular systolic function on cardiac catheter done today. Chest pain is most likely due to GERD. Advise starting the patient on Protonix 40 mg once a day and discharged with follow-up on Tuesday at 11 AM.  Active Problems:   Chest pain, rule out acute myocardial infarction   NSTEMI (non-ST elevated myocardial infarction) (HCC)    Adrian BlackwaterKHAN,Yoona Ishii A, MD, [facility] HelathFACC 08/13/2016 8:10 AM

## 2016-08-13 NOTE — Progress Notes (Signed)
Discharge instructions explained to pt / verbalized an understanding/ iv and tele removed/  Right groin WNL/ RX and work note given to pt/ will transport off unit via wheelchair.

## 2016-08-13 NOTE — Care Management (Signed)
Patient's cath was clean.  She will discharge home on Protonix. Provided her with coupon for [facility].  Medi will cost 13 dollars.  Patient can pay for this medication. Provided her with application for [facility].  Instructed on completion of application prcess.

## 2016-08-13 NOTE — Discharge Summary (Signed)
[facility] [place] [facility]   PATIENT NAME: Kara Pearson    MR#:  [id number]  DATE OF BIRTH:  1975-12-11  DATE OF ADMISSION:  08/11/2016 ADMITTING PHYSICIAN: Tonye Royalty, DO  DATE OF DISCHARGE: 08/13/16  PRIMARY CARE PHYSICIAN: No PCP Per Patient    ADMISSION DIAGNOSIS:  NSTEMI (non-ST elevated myocardial infarction) (HCC) [I21.4]  DISCHARGE DIAGNOSIS:  Chest pain Gastroesophageal reflux disease Elevated troponin  SECONDARY DIAGNOSIS:  History reviewed. No pertinent past medical history.  HOSPITAL COURSE:  Kara Pearson  is a 40 y.o. female admitted 08/11/2016 with chief complaint Chest Pain . Please see H&P performed by Tonye Royalty, DO for further information. Patient presented to the hospital the above symptoms found have elevated troponin began treatment for NSTEMI including anticoagulation. She is evaluated by cardiology and underwent cardiac catheterization which revealed normal coronaries. Patient's pain has improved no further episodes of chest pain  DISCHARGE CONDITIONS:   Stable  CONSULTS OBTAINED:  Treatment Team:  Laurier Nancy, MD  DRUG ALLERGIES:  No Known Allergies  DISCHARGE MEDICATIONS:   Current Discharge Medication List    START taking these medications   Details  pantoprazole (PROTONIX) 40 MG tablet Take 1 tablet (40 mg total) by mouth daily. Qty: 30 tablet, Refills: 0      CONTINUE these medications which have NOT CHANGED   Details  diclofenac (VOLTAREN) 50 MG EC tablet Take 1 tablet (50 mg total) by mouth 2 (two) times daily. Qty: 20 tablet, Refills: 0      STOP taking these medications     methocarbamol (ROBAXIN) 500 MG tablet      metroNIDAZOLE (FLAGYL) 500 MG tablet      oxyCODONE-acetaminophen (PERCOCET) 5-325 MG tablet          DISCHARGE INSTRUCTIONS:    DIET:  Regular diet  DISCHARGE CONDITION:  Stable  ACTIVITY:  Activity as tolerated  OXYGEN:  Home Oxygen: No.   Oxygen  Delivery: room air  DISCHARGE LOCATION:  home   If you experience worsening of your admission symptoms, develop shortness of breath, life threatening emergency, suicidal or homicidal thoughts you must seek medical attention immediately by calling 911 or calling your MD immediately  if symptoms less severe.  You Must read complete instructions/literature along with all the possible adverse reactions/side effects for all the Medicines you take and that have been prescribed to you. Take any new Medicines after you have completely understood and accpet all the possible adverse reactions/side effects.   Please note  You were cared for by a hospitalist during your hospital stay. If you have any questions about your discharge medications or the care you received while you were in the hospital after you are discharged, you can call the unit and asked to speak with the hospitalist on call if the hospitalist that took care of you is not available. Once you are discharged, your primary care physician will handle any further medical issues. Please note that NO REFILLS for any discharge medications will be authorized once you are discharged, as it is imperative that you return to your primary care physician (or establish a relationship with a primary care physician if you do not have one) for your aftercare needs so that they can reassess your need for medications and monitor your lab values.    On the day of Discharge:   VITAL SIGNS:  Blood pressure (!) 146/80, pulse 61, temperature 97.5 F (36.4 C), temperature source Oral, resp. rate 13, height  5\' 8"  (1.727 m), weight 107.1 kg (236 lb 3.2 oz), last menstrual period 07/28/2016, SpO2 99 %.  I/O:   Intake/Output Summary (Last 24 hours) at 08/13/16 1123 Last data filed at 08/13/16 0700  Gross per 24 hour  Intake           381.23 ml  Output                0 ml  Net           381.23 ml    PHYSICAL EXAMINATION:  GENERAL:  40 y.o.-year-old patient  lying in the bed with no acute distress.  EYES: Pupils equal, round, reactive to light and accommodation. No scleral icterus. Extraocular muscles intact.  HEENT: Head atraumatic, normocephalic. Oropharynx and nasopharynx clear.  NECK:  Supple, no jugular venous distention. No thyroid enlargement, no tenderness.  LUNGS: Normal breath sounds bilaterally, no wheezing, rales,rhonchi or crepitation. No use of accessory muscles of respiration.  CARDIOVASCULAR: S1, S2 normal. No murmurs, rubs, or gallops.  ABDOMEN: Soft, non-tender, non-distended. Bowel sounds present. No organomegaly or mass.  EXTREMITIES: No pedal edema, cyanosis, or clubbing.  NEUROLOGIC: Cranial nerves II through XII are intact. Muscle strength 5/5 in all extremities. Sensation intact. Gait not checked.  PSYCHIATRIC: The patient is alert and oriented x 3.  SKIN: No obvious rash, lesion, or ulcer.   DATA REVIEW:   CBC  Recent Labs Lab 08/13/16 0251  WBC 6.0  HGB 9.5*  HCT 29.2*  PLT 152    Chemistries   Recent Labs Lab 08/11/16 1701  NA 139  K 3.8  CL 107  CO2 27  GLUCOSE 96  BUN 13  CREATININE 0.84  CALCIUM 9.2    Cardiac Enzymes  Recent Labs Lab 08/12/16 1257  TROPONINI 0.48*    Microbiology Results  Results for orders placed or performed during the hospital encounter of 05/17/16  Urine culture     Status: None   Collection Time: 05/17/16  7:33 PM  Result Value Ref Range Status   Specimen Description URINE, CLEAN CATCH  Final   Special Requests NONE  Final   Culture NO GROWTH Performed at [facility]   Final   Report Status 05/19/2016 FINAL  Final  Wet prep, genital     Status: Abnormal   Collection Time: 05/17/16  8:28 PM  Result Value Ref Range Status   Yeast Wet Prep HPF POC NONE SEEN NONE SEEN Final   Trich, Wet Prep PRESENT (A) NONE SEEN Final   Clue Cells Wet Prep HPF POC PRESENT (A) NONE SEEN Final   WBC, Wet Prep HPF POC FEW (A) NONE SEEN Final   Sperm NONE SEEN  Final      RADIOLOGY:  Dg Chest 2 View  Result Date: 08/11/2016 CLINICAL DATA:  Acute onset left-sided chest pain radiating to left arm today. EXAM: CHEST  2 VIEW COMPARISON:  01/27/2016 FINDINGS: The heart size and mediastinal contours are within normal limits. Both lungs are clear. The visualized skeletal structures are unremarkable. IMPRESSION: Negative.  No active cardiopulmonary disease. Electronically Signed   By: Myles Rosenthal M.D.   On: 08/11/2016 18:34   Ct Angio Chest Aorta W And/or Wo Contrast  Result Date: 08/11/2016 CLINICAL DATA:  Left-sided chest pain, onset at 15:15 today. EXAM: CT ANGIOGRAPHY CHEST WITH CONTRAST TECHNIQUE: Multidetector CT imaging of the chest was performed using the standard protocol during bolus administration of intravenous contrast. Multiplanar CT image reconstructions and MIPs were obtained to evaluate the  vascular anatomy. CONTRAST:  100 mL Isovue 370 intravenous COMPARISON:  None. FINDINGS: Cardiovascular: There is good opacification of the pulmonary arteries. There is no pulmonary embolism. The thoracic aorta is normal in caliber and intact. Mediastinum/Nodes: No mediastinal or hilar adenopathy. Axillary regions are unremarkable. Lungs/Pleura: The lungs are clear. No pleural effusions. Central airways are patent. Upper Abdomen: No significant abnormality. Musculoskeletal: No significant skeletal lesion Review of the MIP images confirms the above findings. IMPRESSION: Negative for acute pulmonary embolism.  No significant abnormality. Electronically Signed   By: Ellery Plunkaniel R Mitchell M.D.   On: 08/11/2016 21:58     Management plans discussed with the patient, family and they are in agreement.  CODE STATUS:     Code Status Orders        Start     Ordered   08/13/16 0820  Full code  Continuous     08/13/16 0819    Code Status History    Date Active Date Inactive Code Status Order ID Comments User Context   08/13/2016  8:19 AM 08/13/2016  8:19 AM Full Code [id number]   Laurier NancyShaukat A Khan, MD Inpatient   08/12/2016 12:43 AM 08/12/2016 10:09 AM Full Code [id number]  Tonye RoyaltyAlexis Hugelmeyer, DO Inpatient      TOTAL TIME TAKING CARE OF THIS PATIENT: 33 minutes.    Kara Pearson,  Mardi MainlandDavid K M.D on 08/13/2016 at 11:23 AM  Between 7am to 6pm - Pager - [phone]  After 6pm go to [email or web address] - Scientist, research (life sciences)password [email or web address]  [facility] [place] [facility]  Office  [phone]  CC: Primary care physician; No PCP Per Patient

## 2016-08-13 NOTE — Progress Notes (Signed)
   [place] [facility] AT [facility] [address] [place], [place] [address]  August 13, 2016  Patient:  Kara Pearson Date of Birth: 23-Mar-1976 Date of Visit:  08/11/2016  To Whom it May Concern:  Please excuse Kara Pearson from work from 08/11/2016 until 08/13/16 as she was admitted to the [facility] for medical treatment and has been receiving appropriate care. She may return to work on 08/15/16, sooner if she feels she is able to return sooner than this date.      Please don't hesitate to contact me with questions or concerns by calling  [phone] and asking them to page me directly.   Marge Duncansave Mikailah Morel, MD

## 2016-08-13 NOTE — Progress Notes (Signed)
ANTICOAGULATION CONSULT NOTE -Follow up Consult  Pharmacy Consult for heparin Indication: chest pain/ACS  No Known Allergies  Patient Measurements: Height: 5\' 8"  (172.7 cm) Weight: 236 lb 3.2 oz (107.1 kg) IBW/kg (Calculated) : 63.9 Heparin Dosing Weight: 87.9 kg  Vital Signs: Temp: 98.5 F (36.9 C) (09/14 2037) Temp Source: Oral (09/14 2037) BP: 118/86 (09/14 2037) Pulse Rate: 69 (09/14 2037)  Labs:  Recent Labs  08/11/16 1701  08/11/16 2231 08/12/16 0320  08/12/16 0913 08/12/16 1257 08/12/16 2025 08/13/16 0251  HGB 11.7*  --   --   --   --   --   --   --  9.5*  HCT 36.1  --   --   --   --   --   --   --  29.2*  PLT 202  --   --   --   --   --   --   --  152  APTT  --   --  94*  --   --   --   --   --   --   LABPROT  --   --  13.7  --   --   --   --   --   --   INR  --   --  1.05  --   --   --   --   --   --   HEPARINUNFRC  --   --   --   --   < >  --  0.29* 0.45 0.46  CREATININE 0.84  --   --   --   --   --   --   --   --   TROPONINI <0.03  < >  --  1.24*  --  0.70* 0.48*  --   --   < > = values in this interval not displayed.  Estimated Creatinine Clearance: 116.4 mL/min (by C-G formula based on SCr of 0.84 mg/dL).   Medical History: History reviewed. No pertinent past medical history.  Medications:  Infusions:  . sodium chloride 75 mL/hr at 08/12/16 0122  . sodium chloride     Followed by  . sodium chloride    . heparin 1,300 Units/hr (08/12/16 1706)    Assessment: 40 yof cc CP that started after pushing a lawnmower at work. Troponin increased x 1. Pharmacy consulted to dose heparin for ACS.  Goal of Therapy:  Heparin level 0.3-0.7 units/ml Monitor platelets by anticoagulation protocol: Yes   Plan:  Give 4000 units bolus x 1 Start heparin infusion at 1100 units/hr Check anti-Xa level in 6 hours and daily while on heparin Continue to monitor H&H and platelets   9/14: HL @ 0533= 0.34. Will continue current rate and check confirmatory level in 6  hrs at 1130.  9/14: HL @ 1257= 0.29. Will give Bolus of 1300 units x 1 and increase drip to 1300 units/hr. REcheck in 6 hrs at 2000.  9/14: HL @ 2025 = 0.45.  Will continue this pt on current rate of 1300 units/hr and recheck HL on 9/15 @ 0230.   9/15 0251 HL therapeutic x 2. Continue current rate. Pharmacy will follow heparin level and CBC daily.  Carola FrostNathan A Aliyha Fornes, Pharm.D., BCPS Clinical Pharmacist 08/13/2016,3:47 AM

## 2017-04-06 ENCOUNTER — Emergency Department
Admission: EM | Admit: 2017-04-06 | Discharge: 2017-04-06 | Disposition: A | Discharge status: Home / Self Care | Payer: Medicaid Other | Attending: Emergency Medicine | Admitting: Emergency Medicine

## 2017-04-06 ENCOUNTER — Encounter: Payer: Self-pay | Admitting: Emergency Medicine

## 2017-04-06 ENCOUNTER — Emergency Department: Payer: Medicaid Other

## 2017-04-06 DIAGNOSIS — R079 Chest pain, unspecified: Secondary | ICD-10-CM | POA: Insufficient documentation

## 2017-04-06 HISTORY — DX: Anemia, unspecified: D64.9

## 2017-04-06 HISTORY — DX: Acute myocardial infarction, unspecified: I21.9

## 2017-04-06 LAB — COMPREHENSIVE METABOLIC PANEL
ALK PHOS: 44 U/L (ref 38–126)
ALT: 10 U/L — ABNORMAL LOW (ref 14–54)
AST: 20 U/L (ref 15–41)
Albumin: 3.3 g/dL — ABNORMAL LOW (ref 3.5–5.0)
Anion gap: 7 (ref 5–15)
BILIRUBIN TOTAL: 0.5 mg/dL (ref 0.3–1.2)
BUN: 11 mg/dL (ref 6–20)
CALCIUM: 8.8 mg/dL — AB (ref 8.9–10.3)
CO2: 22 mmol/L (ref 22–32)
Chloride: 109 mmol/L (ref 101–111)
Creatinine, Ser: 0.79 mg/dL (ref 0.44–1.00)
GFR calc Af Amer: >60 mL/min (ref >60)
Glucose, Bld: 93 mg/dL (ref 65–99)
POTASSIUM: 3.5 mmol/L (ref 3.5–5.1)
Sodium: 138 mmol/L (ref 135–145)
TOTAL PROTEIN: 6.9 g/dL (ref 6.5–8.1)

## 2017-04-06 LAB — CBC
HCT: 30.3 % — ABNORMAL LOW (ref 35.0–47.0)
Hemoglobin: 9.7 g/dL — ABNORMAL LOW (ref 12.0–16.0)
MCH: 22.5 pg — ABNORMAL LOW (ref 26.0–34.0)
MCHC: 32 g/dL (ref 32.0–36.0)
MCV: 70.4 fL — AB (ref 80.0–100.0)
PLATELETS: 163 10*3/uL (ref 150–440)
RBC: 4.3 MIL/uL (ref 3.80–5.20)
RDW: 18.3 % — AB (ref 11.5–14.5)
WBC: 4.7 10*3/uL (ref 3.6–11.0)

## 2017-04-06 LAB — TROPONIN I: Troponin I: <0.03 ng/mL (ref <0.03)

## 2017-04-06 MED ORDER — MORPHINE SULFATE (PF) 4 MG/ML IV SOLN
4.0000 mg | Freq: Once | INTRAVENOUS | Status: AC
  Administered 2017-04-06: 4 mg via INTRAVENOUS
  Filled 2017-04-06: qty 1

## 2017-04-06 NOTE — ED Notes (Signed)
Pt unhooked from monitor and assisted to the bathroom by her daughter. Pt is visualized in NAD at this time. Will continue to monitor for further patient needs.

## 2017-04-06 NOTE — ED Notes (Signed)
Pt visualized in NAD. Resting in bed with lights dimmed and TV on. Pt's daughter remains at bedside at this time. Will continue to monitor for further patient needs.

## 2017-04-06 NOTE — ED Triage Notes (Signed)
Pt presents to ED via [facility] with c/o chest pain that is midline, sharp in nature with no radiation to extremities, and no N/V/SHOB. Pt states had an MI in September but received no stents, states this feels "kind of the same, except [her] hand is not numb". EMS reports this morning patient began having a headache, per EMS pt was found in R recumbent position this morning. Pt arrives to ED alert and oriented, NAD noted at this time.

## 2017-04-06 NOTE — ED Provider Notes (Signed)
[facility] Emergency Department Provider Note       Time seen: ----------------------------------------- 8:27 AM on 04/06/2017 -----------------------------------------     I have reviewed the triage vital signs and the nursing notes.   HISTORY   Chief Complaint No chief complaint on file.    HPI Kara Pearson is a 41 y.o. female who presents to the ED for chest pain. She describes a sharp left sided chest pain that is similar to what she's had in the past. She has had cold symptoms recently but denies any significant cough. She denies fevers, chills, shortness of breath, vomiting or diarrhea. Patient states she received aspirin and nitroglycerin prior to arrival with mild improvement in her symptoms. She had a heart catheter in September of last year that was unremarkable.   No past medical history on file.  Patient Active Problem List   Diagnosis Date Noted  . NSTEMI (non-ST elevated myocardial infarction) (HCC) 08/12/2016  . Chest pain, rule out acute myocardial infarction 08/11/2016    Past Surgical History:  Procedure Laterality Date  . CARDIAC CATHETERIZATION N/A 08/13/2016   Procedure: Left Heart Cath and Coronary Angiography;  Surgeon: Laurier NancyShaukat A Khan, MD;  Location: [facility] INVASIVE CV LAB;  Service: Cardiovascular;  Laterality: N/A;  . TUBAL LIGATION      Allergies Patient has no known allergies.  Social History Social History  Substance Use Topics  . Smoking status: Never Smoker  . Smokeless tobacco: Never Used  . Alcohol use No    Review of Systems Constitutional: Negative for fever. Eyes: Negative for vision changes ENT:  Negative for congestion, sore throat Cardiovascular: Positive for chest pain Respiratory: Negative for shortness of breath. Gastrointestinal: Negative for abdominal pain, vomiting and diarrhea. Genitourinary: Negative for dysuria. Musculoskeletal: Negative for back pain. Skin: Negative for  rash. Neurological: Negative for headaches, focal weakness or numbness.  All systems negative/normal/unremarkable except as stated in the HPI  ____________________________________________   PHYSICAL EXAM:  VITAL SIGNS: ED Triage Vitals  Enc Vitals Group     BP      Pulse      Resp      Temp      Temp src      SpO2      Weight      Height      Head Circumference      Peak Flow      Pain Score      Pain Loc      Pain Edu?      Excl. in GC?     Constitutional: Alert and oriented. Well appearing and in no distress. Eyes: Conjunctivae are normal. PERRL. Normal extraocular movements. ENT   Head: Normocephalic and atraumatic.   Nose: No congestion/rhinnorhea.   Mouth/Throat: Mucous membranes are moist.   Neck: No stridor. Cardiovascular: Normal rate, regular rhythm. No murmurs, rubs, or gallops. Respiratory: Normal respiratory effort without tachypnea nor retractions. Breath sounds are clear and equal bilaterally. No wheezes/rales/rhonchi. Gastrointestinal: Soft and nontender. Normal bowel sounds Musculoskeletal: Nontender with normal range of motion in extremities. No lower extremity tenderness nor edema. Neurologic:  Normal speech and language. No gross focal neurologic deficits are appreciated.  Skin:  Skin is warm, dry and intact. No rash noted. Psychiatric: Mood and affect are normal. Speech and behavior are normal.  ____________________________________________  EKG: Interpreted by me.Sinus rhythm rate of 60 bpm, normal PR interval, normal QRS, normal QT.  ____________________________________________  ED COURSE:  Pertinent labs & imaging results that  were available during my care of the patient were reviewed by me and considered in my medical decision making (see chart for details). Patient presents for chest pain, we will assess with labs and imaging as indicated.   Procedures ____________________________________________   LABS (pertinent  positives/negatives)  Labs Reviewed  CBC - Abnormal; Notable for the following:       Result Value   Hemoglobin 9.7 (*)    HCT 30.3 (*)    MCV 70.4 (*)    MCH 22.5 (*)    RDW 18.3 (*)    All other components within normal limits  COMPREHENSIVE METABOLIC PANEL - Abnormal; Notable for the following:    Calcium 8.8 (*)    Albumin 3.3 (*)    ALT 10 (*)    All other components within normal limits  TROPONIN I  TROPONIN I    RADIOLOGY  Chest x-ray  IMPRESSION: No active disease. ____________________________________________  FINAL ASSESSMENT AND PLAN  Chest pain  Plan: Patient's labs and imaging were dictated above. Patient had presented for Chest pain of uncertain etiology. Repeat troponin was negative, she is low risk for ACS and had a negative heart catheterization less than a year ago. She is stable for outpatient follow-up.   Emily Filbert, MD   Note: This note was generated in part or whole with voice recognition software. Voice recognition is usually quite accurate but there are transcription errors that can and very often do occur. I apologize for any typographical errors that were not detected and corrected.     Emily Filbert, MD 04/06/17 [phone]

## 2017-04-06 NOTE — ED Notes (Signed)
Pt placed back on monitor. NAD noted. Will continue to monitor for further patient needs at this time. Pt requesting something to eat, explained would need to consult MD, pt states understanding.

## 2017-09-19 IMAGING — CT CT ANGIO CHEST
3 of 7 series · 18 of 46 positions shown · IV contrast (APPLIED)
Comparison: None.

CLINICAL DATA: Left-sided chest pain, onset at [DATE] today.

EXAM:
CT ANGIOGRAPHY CHEST WITH CONTRAST
TECHNIQUE: Multidetector CT imaging of the chest was performed using the
standard protocol during bolus administration of intravenous
contrast. Multiplanar CT image reconstructions and MIPs were
obtained to evaluate the vascular anatomy.
CONTRAST:  100 mL Isovue 370 intravenous

[Series 4: axial arterial · axial · arterial · 0.69mm/px · z∈[-571,-340]mm · 12 of 93 slices shown]
[im 8/93  lung]
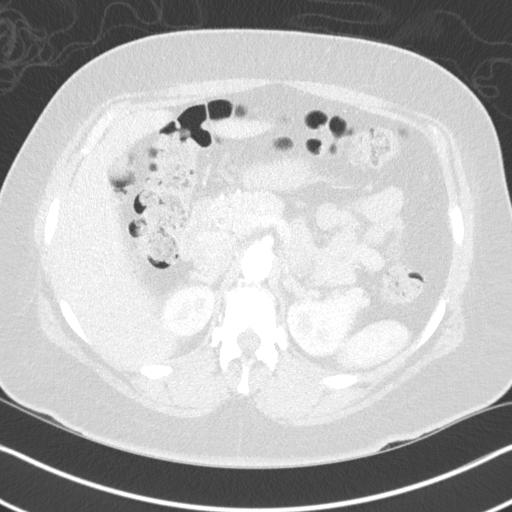
[im 15/93  soft-tissue]
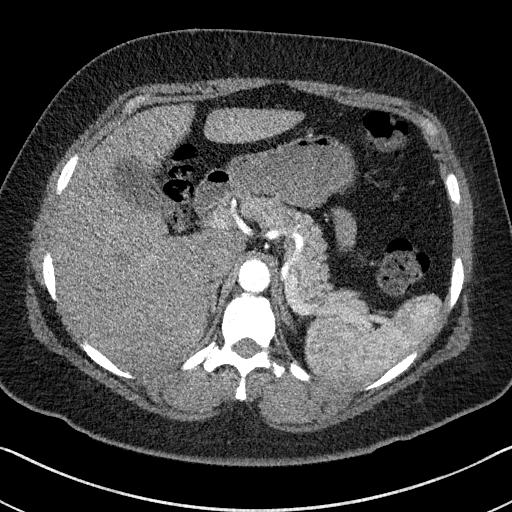
[im 22/93  lung]
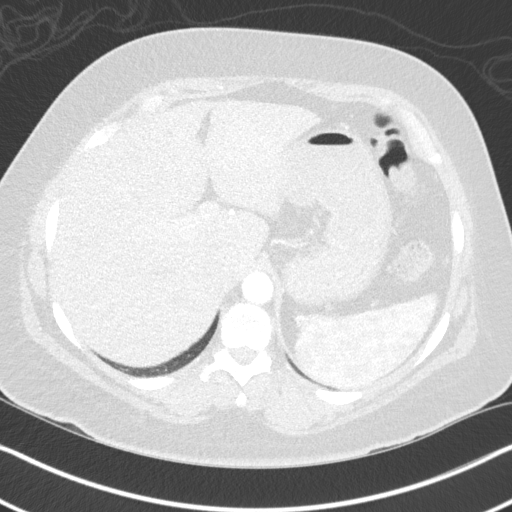
[im 29/93  soft-tissue]
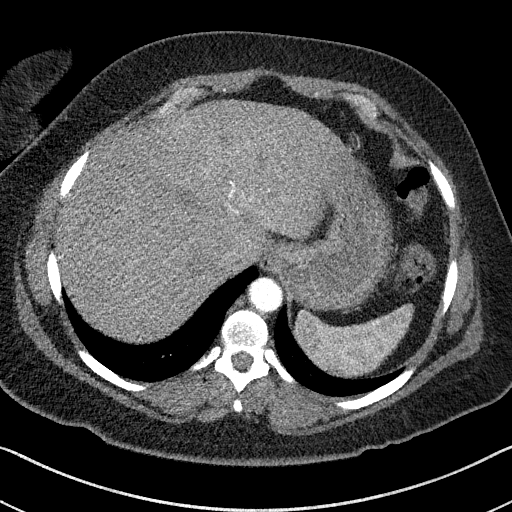
[im 36/93  lung]
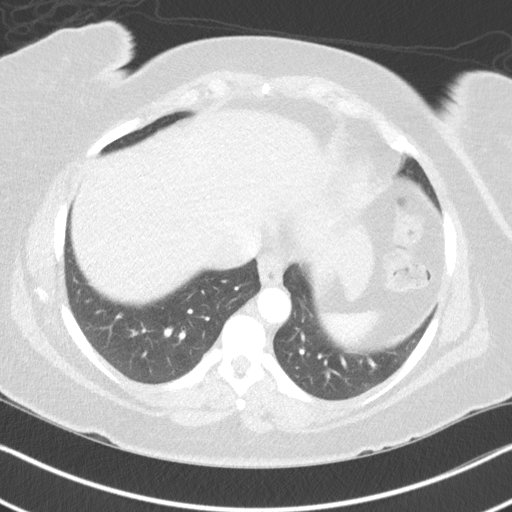
[im 43/93  soft-tissue]
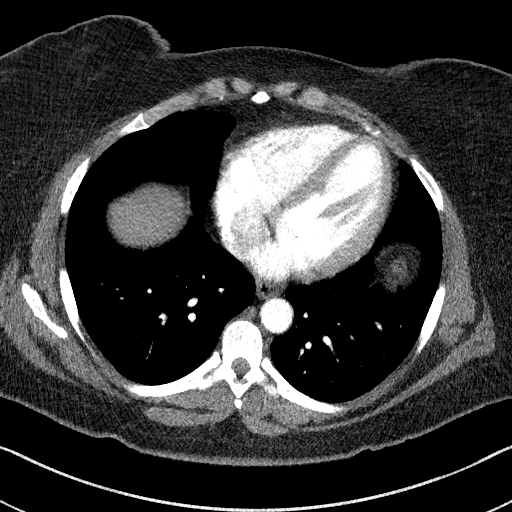
[im 50/93  lung]
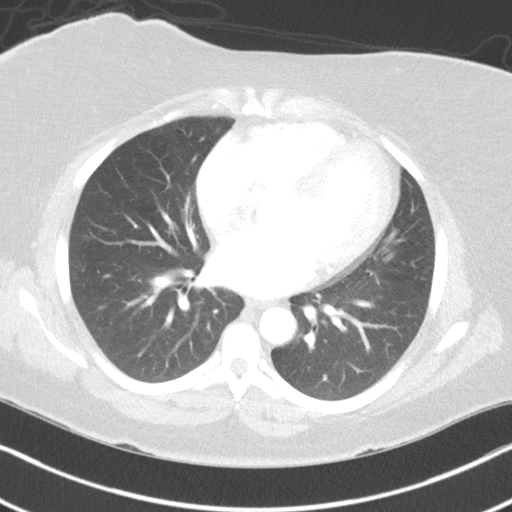
[im 57/93  soft-tissue]
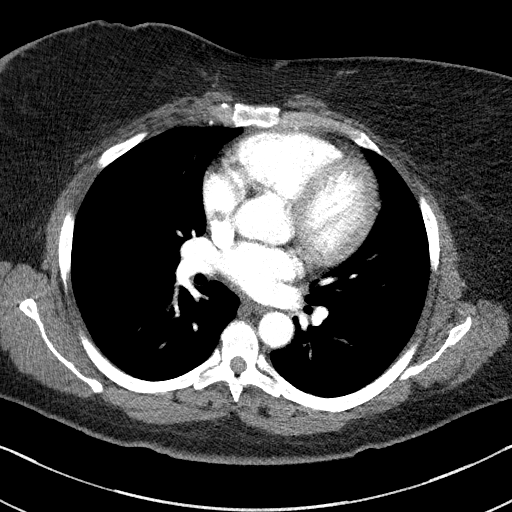
[im 64/93  lung]
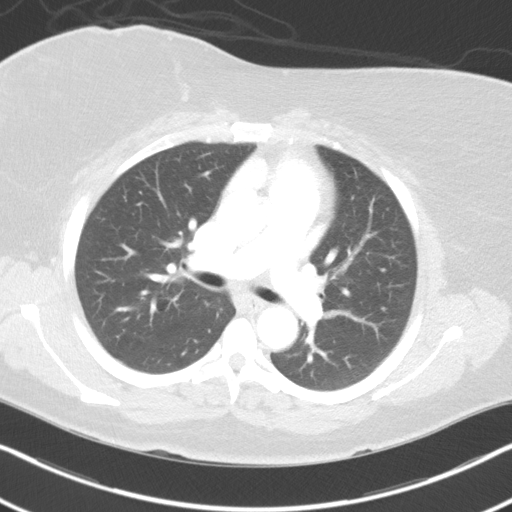
[im 71/93  soft-tissue]
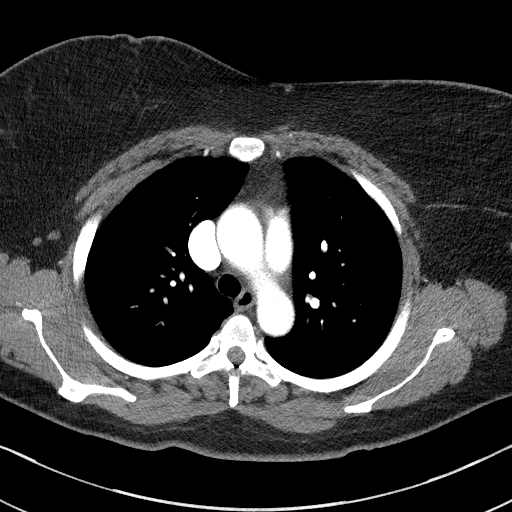
[im 78/93  lung]
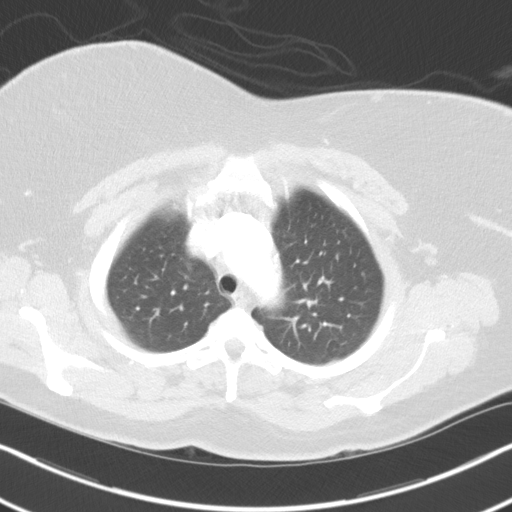
[im 85/93  soft-tissue]
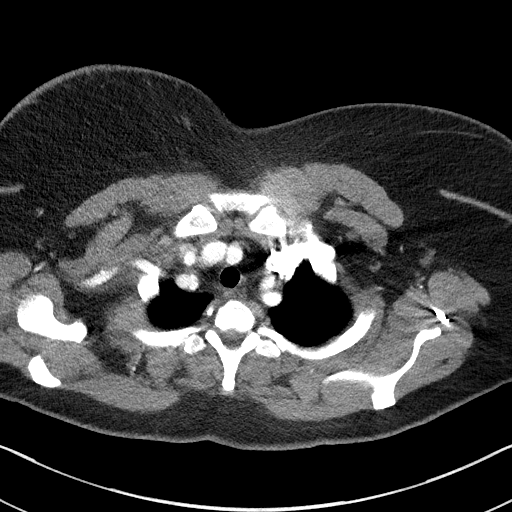

[Series 5: lung · axial · 0.69mm/px · z∈[-561,-491]mm · 3 of 56 slices shown]
[im 7/56  soft-tissue]
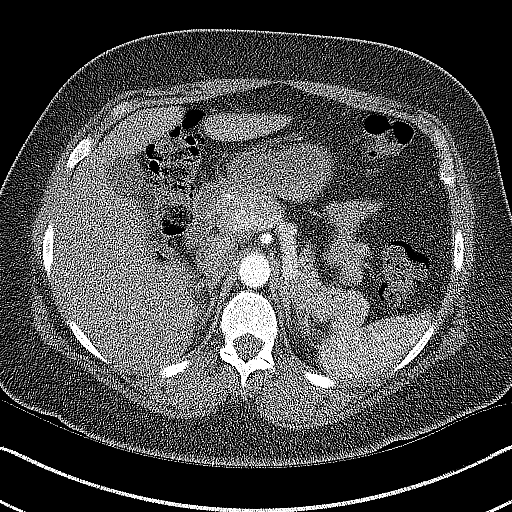
[im 14/56  soft-tissue]
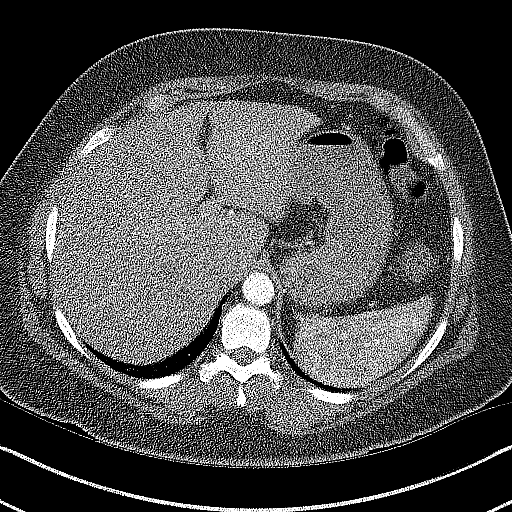
[im 21/56  soft-tissue]
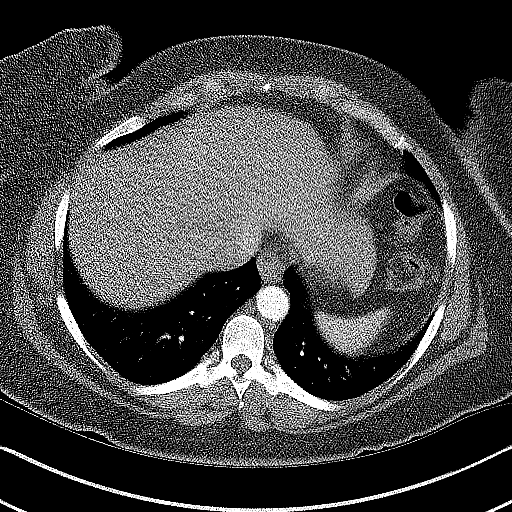

[Series 6: coronals · coronal · 0.57mm/px · 3 of 136 slices shown]
[im 34/136  soft-tissue]
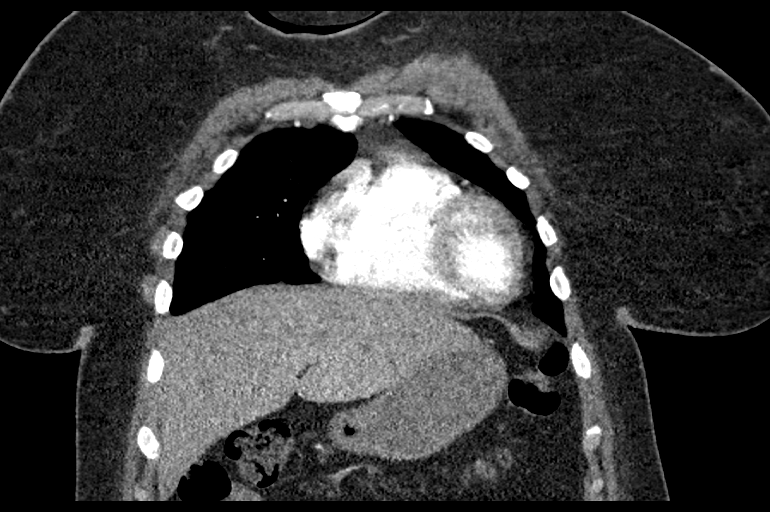
[im 68/136  soft-tissue]
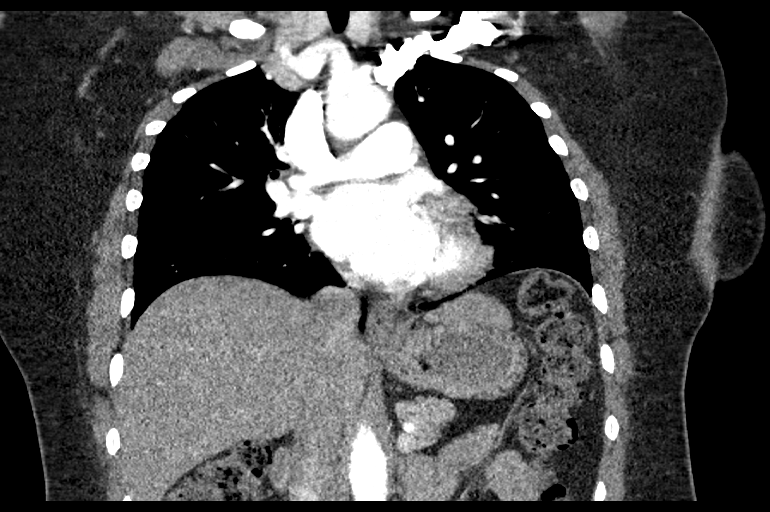
[im 102/136  soft-tissue]
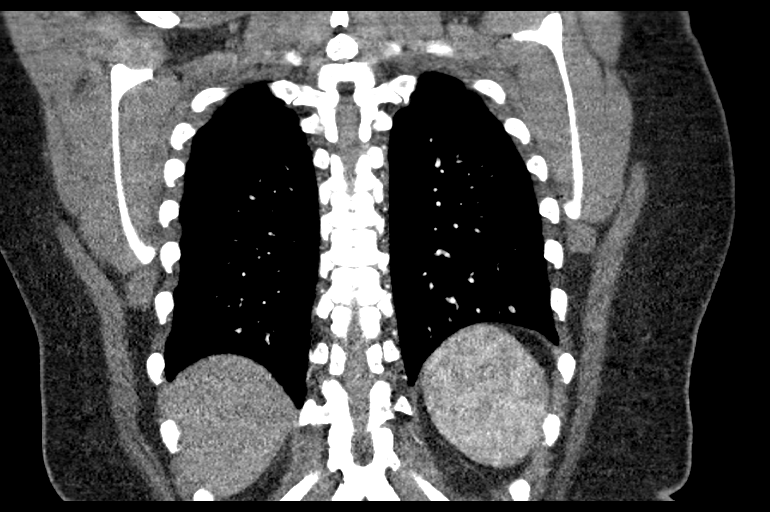

[18 of 46 positions shown; findings below may reference images not displayed]

FINDINGS: Cardiovascular: There is good opacification of the pulmonary
arteries. There is no pulmonary embolism. The thoracic aorta is
normal in caliber and intact.

Mediastinum/Nodes: No mediastinal or hilar adenopathy. Axillary
regions are unremarkable.

Lungs/Pleura: The lungs are clear. No pleural effusions. Central
airways are patent.

Upper Abdomen: No significant abnormality.

Musculoskeletal: No significant skeletal lesion

Review of the MIP images confirms the above findings.
IMPRESSION: Negative for acute pulmonary embolism.  No significant abnormality.

## 2017-10-25 ENCOUNTER — Other Ambulatory Visit: Payer: Self-pay

## 2017-10-25 ENCOUNTER — Emergency Department
Admission: EM | Admit: 2017-10-25 | Discharge: 2017-10-25 | Disposition: A | Discharge status: Home / Self Care | Payer: Self-pay | Attending: Emergency Medicine | Admitting: Emergency Medicine

## 2017-10-25 DIAGNOSIS — K047 Periapical abscess without sinus: Secondary | ICD-10-CM | POA: Insufficient documentation

## 2017-10-25 DIAGNOSIS — K0889 Other specified disorders of teeth and supporting structures: Secondary | ICD-10-CM | POA: Insufficient documentation

## 2017-10-25 MED ORDER — LIDOCAINE VISCOUS 2 % MT SOLN
15.0000 mL | Freq: Once | OROMUCOSAL | Status: AC
  Administered 2017-10-25: 15 mL via OROMUCOSAL
  Filled 2017-10-25: qty 15

## 2017-10-25 MED ORDER — CLINDAMYCIN HCL 150 MG PO CAPS
300.0000 mg | ORAL_CAPSULE | Freq: Once | ORAL | Status: AC
  Administered 2017-10-25: 300 mg via ORAL
  Filled 2017-10-25: qty 2

## 2017-10-25 MED ORDER — KETOROLAC TROMETHAMINE 60 MG/2ML IM SOLN
60.0000 mg | Freq: Once | INTRAMUSCULAR | Status: AC
  Administered 2017-10-25: 60 mg via INTRAMUSCULAR
  Filled 2017-10-25: qty 2

## 2017-10-25 MED ORDER — TRAMADOL HCL 50 MG PO TABS: 50.0000 mg | ORAL_TABLET | Freq: Four times a day (QID) | ORAL | 0 refills | Status: DC | PRN

## 2017-10-25 MED ORDER — CLINDAMYCIN HCL 300 MG PO CAPS: 300.0000 mg | ORAL_CAPSULE | Freq: Three times a day (TID) | ORAL | 0 refills | Status: AC

## 2017-10-25 MED ORDER — TRAMADOL HCL 50 MG PO TABS
50.0000 mg | ORAL_TABLET | Freq: Once | ORAL | Status: AC
  Administered 2017-10-25: 50 mg via ORAL
  Filled 2017-10-25: qty 1

## 2017-10-25 NOTE — ED Provider Notes (Signed)
[facility] Emergency Department Provider Note   ____________________________________________   First MD Initiated Contact with Patient 10/25/17 [phone]     (approximate)  I have reviewed the triage vital signs and the nursing notes.   HISTORY  Chief Complaint Abscess    HPI Kara Pearson is a 41 y.o. female who comes into the hospital today with some facial swelling.  She reports that she has some ear pain and the swelling is going into her eye.  The patient's upper gum started hurting yesterday and she does have many teeth that need to be pulled.  The patient states that she woke up at 3 AM and she noticed that her right face was swollen.  She has been taking ibuprofen at home with her last dose being about 2 PM.  The patient denies any fevers but reports her pain is a 10 out of 10 in intensity currently.  The patient is here today for evaluation.  Past Medical History:  Diagnosis Date  . Anemia   . MI (myocardial infarction) [facility](HCC)     Patient Active Problem List   Diagnosis Date Noted  . NSTEMI (non-ST elevated myocardial infarction) (HCC) 08/12/2016  . Chest pain, rule out acute myocardial infarction 08/11/2016    Past Surgical History:  Procedure Laterality Date  . CARDIAC CATHETERIZATION N/A 08/13/2016   Procedure: Left Heart Cath and Coronary Angiography;  Surgeon: Laurier NancyShaukat A Khan, MD;  Location: [facility] INVASIVE CV LAB;  Service: Cardiovascular;  Laterality: N/A;  . TUBAL LIGATION      Prior to Admission medications   Medication Sig Start Date End Date Taking? Authorizing Provider  clindamycin (CLEOCIN) 300 MG capsule Take 1 capsule (300 mg total) by mouth 3 (three) times daily for 10 days. 10/25/17 11/04/17  Rebecka ApleyWebster, Hindy Perrault P, MD  pantoprazole (PROTONIX) 40 MG tablet Take 1 tablet (40 mg total) by mouth daily. Patient not taking: Reported on 04/06/2017 08/13/16   Hower, Cletis Athensavid K, MD  traMADol (ULTRAM) 50 MG tablet Take 1 tablet (50 mg total) by  mouth every 6 (six) hours as needed. 10/25/17   Rebecka ApleyWebster, Aryona Sill P, MD    Allergies Patient has no known allergies.  No family history on file.  Social History Social History   Tobacco Use  . Smoking status: Never Smoker  . Smokeless tobacco: Never Used  Substance Use Topics  . Alcohol use: No  . Drug use: No    Review of Systems  Constitutional: No fever/chills Eyes: No visual changes. ENT: Right-sided facial swelling, dental disease Cardiovascular: Denies chest pain. Respiratory: Denies shortness of breath. Gastrointestinal: No abdominal pain.  No nausea, no vomiting.  No diarrhea.  No constipation. Genitourinary: Negative for dysuria. Musculoskeletal: Negative for back pain. Skin: Negative for rash. Neurological: Negative for headaches, focal weakness or numbness.   ____________________________________________   PHYSICAL EXAM:  VITAL SIGNS: ED Triage Vitals  Enc Vitals Group     BP 10/25/17 0545 (!) 153/112     Pulse Rate 10/25/17 0545 100     Resp 10/25/17 0545 20     Temp 10/25/17 0545 99 F (37.2 C)     Temp Source 10/25/17 0545 Oral     SpO2 10/25/17 0545 100 %     Weight 10/25/17 0546 230 lb (104.3 kg)     Height 10/25/17 0546 5\' 8"  (1.727 m)     Head Circumference --      Peak Flow --      Pain Score 10/25/17 0545  10     Pain Loc --      Pain Edu? --      Excl. in GC? --     Constitutional: Alert and oriented. Well appearing and in moderate distress. Eyes: Conjunctivae are normal. PERRL. EOMI. Head: Atraumatic. Nose: No congestion/rhinnorhea. Mouth/Throat:  Swelling to maxillary face and some extensive dental disease, Mucous membranes are moist.  Oropharynx non-erythematous. Cardiovascular: Normal rate, regular rhythm. Grossly normal heart sounds.  Good peripheral circulation. Respiratory: Normal respiratory effort.  No retractions. Lungs CTAB. Gastrointestinal: Soft and nontender. No distention.  Positive bowel sounds Musculoskeletal: No  lower extremity tenderness nor edema.   Neurologic:  Normal speech and language.  Skin:  Skin is warm, dry and intact.  Psychiatric: Mood and affect are normal.   ____________________________________________   LABS (all labs ordered are listed, but only abnormal results are displayed)  Labs Reviewed - No data to display ____________________________________________  EKG  none ____________________________________________  RADIOLOGY  No results found.  ____________________________________________   PROCEDURES  Procedure(s) performed: None  Procedures  Critical Care performed: No  ____________________________________________   INITIAL IMPRESSION / ASSESSMENT AND PLAN / ED COURSE  As part of my medical decision making, I reviewed the following data within the electronic MEDICAL RECORD NUMBER Notes from prior ED visits and [place] Controlled Substance Database   This is a 41 year old female who comes into the hospital today with some right side facial swelling.  The patient has significant dental disease which is likely the cause for her facial swelling.  The patient also has pulpitis or dental abscess.  I did give the patient a dose of clindamycin and a shot of Toradol and tramadol.  The patient also received some viscous lidocaine.  She needs to follow-up with a dentist for further treatment of her dental caries and abscess.  She will be discharged home.      ____________________________________________   FINAL CLINICAL IMPRESSION(S) / ED DIAGNOSES  Final diagnoses:  Dental abscess  Pain, dental     ED Discharge Orders        Ordered    clindamycin (CLEOCIN) 300 MG capsule  3 times daily     10/25/17 0659    traMADol (ULTRAM) 50 MG tablet  Every 6 hours PRN     10/25/17 0659       Note:  This document was prepared using Dragon voice recognition software and may include unintentional dictation errors.    Rebecka ApleyWebster, Malakai Schoenherr P, MD 10/25/17 [phone]

## 2017-10-25 NOTE — Discharge Instructions (Signed)
Please follow up with a dentist for further evaluation of your dental abscess

## 2017-10-25 NOTE — ED Triage Notes (Signed)
Pt noted gum pain yesterday and today woke up with large amt of swelling to right cheek area.

## 2018-08-04 ENCOUNTER — Encounter: Payer: Self-pay | Admitting: Emergency Medicine

## 2018-08-04 ENCOUNTER — Emergency Department
Admission: EM | Admit: 2018-08-04 | Discharge: 2018-08-04 | Disposition: A | Discharge status: Home / Self Care | Payer: Self-pay | Attending: Emergency Medicine | Admitting: Emergency Medicine

## 2018-08-04 ENCOUNTER — Other Ambulatory Visit: Payer: Self-pay

## 2018-08-04 DIAGNOSIS — I252 Old myocardial infarction: Secondary | ICD-10-CM | POA: Insufficient documentation

## 2018-08-04 DIAGNOSIS — R109 Unspecified abdominal pain: Secondary | ICD-10-CM | POA: Insufficient documentation

## 2018-08-04 DIAGNOSIS — N3090 Cystitis, unspecified without hematuria: Secondary | ICD-10-CM | POA: Insufficient documentation

## 2018-08-04 DIAGNOSIS — I2510 Atherosclerotic heart disease of native coronary artery without angina pectoris: Secondary | ICD-10-CM | POA: Insufficient documentation

## 2018-08-04 LAB — CBC
HCT: 31.3 % — ABNORMAL LOW (ref 35.0–47.0)
HEMOGLOBIN: 10 g/dL — AB (ref 12.0–16.0)
MCH: 22.6 pg — AB (ref 26.0–34.0)
MCHC: 31.8 g/dL — AB (ref 32.0–36.0)
MCV: 71 fL — AB (ref 80.0–100.0)
Platelets: 197 10*3/uL (ref 150–440)
RBC: 4.4 MIL/uL (ref 3.80–5.20)
RDW: 17.8 % — ABNORMAL HIGH (ref 11.5–14.5)
WBC: 5.6 10*3/uL (ref 3.6–11.0)

## 2018-08-04 LAB — URINALYSIS, COMPLETE (UACMP) WITH MICROSCOPIC
Bilirubin Urine: NEGATIVE
GLUCOSE, UA: NEGATIVE mg/dL
KETONES UR: NEGATIVE mg/dL
Nitrite: NEGATIVE
PH: 6 (ref 5.0–8.0)
Protein, ur: 30 mg/dL — AB
Specific Gravity, Urine: 1.023 (ref 1.005–1.030)

## 2018-08-04 LAB — HCG, QUANTITATIVE, PREGNANCY: hCG, Beta Chain, Quant, S: <1 m[IU]/mL (ref <5)

## 2018-08-04 LAB — COMPREHENSIVE METABOLIC PANEL
ALT: 14 U/L (ref 0–44)
AST: 17 U/L (ref 15–41)
Albumin: 3.5 g/dL (ref 3.5–5.0)
Alkaline Phosphatase: 55 U/L (ref 38–126)
Anion gap: 8 (ref 5–15)
BILIRUBIN TOTAL: 0.4 mg/dL (ref 0.3–1.2)
BUN: 11 mg/dL (ref 6–20)
CHLORIDE: 109 mmol/L (ref 98–111)
CO2: 22 mmol/L (ref 22–32)
Calcium: 8.6 mg/dL — ABNORMAL LOW (ref 8.9–10.3)
Creatinine, Ser: 0.75 mg/dL (ref 0.44–1.00)
Glucose, Bld: 96 mg/dL (ref 70–99)
POTASSIUM: 3.9 mmol/L (ref 3.5–5.1)
Sodium: 139 mmol/L (ref 135–145)
TOTAL PROTEIN: 7.2 g/dL (ref 6.5–8.1)

## 2018-08-04 LAB — POCT PREGNANCY, URINE: Preg Test, Ur: NEGATIVE

## 2018-08-04 LAB — LIPASE, BLOOD: LIPASE: 27 U/L (ref 11–51)

## 2018-08-04 MED ORDER — NITROFURANTOIN MACROCRYSTAL 100 MG PO CAPS: 100.0000 mg | ORAL_CAPSULE | Freq: Two times a day (BID) | ORAL | 0 refills | Status: DC

## 2018-08-04 NOTE — ED Provider Notes (Signed)
[facility] Emergency Department Provider Note  ____________________________________________  Time seen: Approximately 12:55 PM  I have reviewed the triage vital signs and the nursing notes.   HISTORY  Chief Complaint Abdominal Pain    HPI Kara Pearson is a 42 y.o. female with a history of CAD and non-STEMI who complains of vague abdominal discomfort and nausea without vomiting or diarrhea for the past 2 months.  Symptoms are intermittent, no aggravating or alleviating factors, mild.  Mostly upper abdomen.  She wonders if she is pregnant.  She had a tubal ligation 13 years ago.  No regular vaginal bleeding or discharge.  She took a home pregnancy test yesterday which was negative.  She is tolerating oral intake.  She notes that her daughter was being seen in the ED today, and she figured while she was here she could check again and just double check to make sure she is not pregnant.      Past Medical History:  Diagnosis Date  . Anemia   . MI (myocardial infarction) [facility])      Patient Active Problem List   Diagnosis Date Noted  . NSTEMI (non-ST elevated myocardial infarction) (HCC) 08/12/2016  . Chest pain, rule out acute myocardial infarction 08/11/2016     Past Surgical History:  Procedure Laterality Date  . CARDIAC CATHETERIZATION N/A 08/13/2016   Procedure: Left Heart Cath and Coronary Angiography;  Surgeon: Laurier Nancy, MD;  Location: [facility] INVASIVE CV LAB;  Service: Cardiovascular;  Laterality: N/A;  . TUBAL LIGATION       Prior to Admission medications   Medication Sig Start Date End Date Taking? Authorizing Provider  nitrofurantoin (MACRODANTIN) 100 MG capsule Take 1 capsule (100 mg total) by mouth 2 (two) times daily. 08/04/18   Sharman Cheek, MD  pantoprazole (PROTONIX) 40 MG tablet Take 1 tablet (40 mg total) by mouth daily. Patient not taking: Reported on 04/06/2017 08/13/16   Hower, Cletis Athens, MD  traMADol (ULTRAM) 50 MG tablet Take 1  tablet (50 mg total) by mouth every 6 (six) hours as needed. 10/25/17   Rebecka Apley, MD     Allergies Patient has no known allergies.   History reviewed. No pertinent family history.  Social History Social History   Tobacco Use  . Smoking status: Never Smoker  . Smokeless tobacco: Never Used  Substance Use Topics  . Alcohol use: No  . Drug use: No    Review of Systems  Constitutional:   No fever or chills.  ENT:   No sore throat. No rhinorrhea. Cardiovascular:   No chest pain or syncope. Respiratory:   No dyspnea or cough. Gastrointestinal:   Positive as above for vague abdominal pain without vomiting and diarrhea.  Musculoskeletal:   Negative for focal pain or swelling All other systems reviewed and are negative except as documented above in ROS and HPI.  ____________________________________________   PHYSICAL EXAM:  VITAL SIGNS: ED Triage Vitals  Enc Vitals Group     BP 08/04/18 1041 (!) 125/46     Pulse Rate 08/04/18 1041 98     Resp 08/04/18 1041 18     Temp 08/04/18 1041 98.8 F (37.1 C)     Temp Source 08/04/18 1041 Oral     SpO2 08/04/18 1041 100 %     Weight 08/04/18 1042 240 lb (108.9 kg)     Height 08/04/18 1042 5\' 8"  (1.727 m)     Head Circumference --      Peak  Flow --      Pain Score 08/04/18 1042 0     Pain Loc --      Pain Edu? --      Excl. in GC? --     Vital signs reviewed, nursing assessments reviewed.   Constitutional:   Alert and oriented. Non-toxic appearance. Eyes:   Conjunctivae are normal. EOMI. PERRL. ENT      Head:   Normocephalic and atraumatic.      Nose:   No congestion/rhinnorhea.       Mouth/Throat:   MMM, no pharyngeal erythema. No peritonsillar mass.       Neck:   No meningismus. Full ROM. Hematological/Lymphatic/Immunilogical:   No cervical lymphadenopathy. Cardiovascular:   RRR. Symmetric bilateral radial and DP pulses.  No murmurs. Cap refill less than 2 seconds. Respiratory:   Normal respiratory effort  without tachypnea/retractions. Breath sounds are clear and equal bilaterally. No wheezes/rales/rhonchi. Gastrointestinal:   Soft and nontender. Non distended. There is no CVA tenderness.  No rebound, rigidity, or guarding. Genitourinary:   deferred Musculoskeletal:   Normal range of motion in all extremities. No joint effusions.  No lower extremity tenderness.  No edema. Neurologic:   Normal speech and language.  Motor grossly intact. No acute focal neurologic deficits are appreciated.  Skin:    Skin is warm, dry and intact. No rash noted.  No petechiae, purpura, or bullae.  ____________________________________________    LABS (pertinent positives/negatives) (all labs ordered are listed, but only abnormal results are displayed) Labs Reviewed  COMPREHENSIVE METABOLIC PANEL - Abnormal; Notable for the following components:      Result Value   Calcium 8.6 (*)    All other components within normal limits  CBC - Abnormal; Notable for the following components:   Hemoglobin 10.0 (*)    HCT 31.3 (*)    MCV 71.0 (*)    MCH 22.6 (*)    MCHC 31.8 (*)    RDW 17.8 (*)    All other components within normal limits  URINALYSIS, COMPLETE (UACMP) WITH MICROSCOPIC - Abnormal; Notable for the following components:   Color, Urine YELLOW (*)    APPearance CLOUDY (*)    Hgb urine dipstick SMALL (*)    Protein, ur 30 (*)    Leukocytes, UA LARGE (*)    Bacteria, UA RARE (*)    All other components within normal limits  LIPASE, BLOOD  HCG, QUANTITATIVE, PREGNANCY  POC URINE PREG, ED  POCT PREGNANCY, URINE   ____________________________________________   EKG    ____________________________________________    RADIOLOGY  No results found.  ____________________________________________   PROCEDURES Procedures  ____________________________________________    CLINICAL IMPRESSION / ASSESSMENT AND PLAN / ED COURSE  Pertinent labs & imaging results that were available during my care of  the patient were reviewed by me and considered in my medical decision making (see chart for details).    Patient presents with vague upper abdominal pain that is intermittent.  She is well-appearing and tolerating oral intake now.  She request confirmation that she is not pregnant.  Serum hCG is negative today.  Her other labs are all unremarkable.  Vital signs are normal.  Exam is benign and reassuring.  I suspect that her symptoms are related to GERD.Considering the patient's symptoms, medical history, and physical examination today, I have low suspicion for cholecystitis or biliary pathology, pancreatitis, perforation or bowel obstruction, hernia, intra-abdominal abscess, AAA or dissection, volvulus or intussusception, mesenteric ischemia, or appendicitis.  Counseled the patient on  her chronic anemia.  She states that she is to take iron supplements but stopped.  Encouraged her to restart and counseled her on tips for effective use of iron supplements.  Encouraged her to obtain a primary care doctor.      ____________________________________________   FINAL CLINICAL IMPRESSION(S) / ED DIAGNOSES    Final diagnoses:  Abdominal pain, unspecified abdominal location  Cystitis     ED Discharge Orders         Ordered    nitrofurantoin (MACRODANTIN) 100 MG capsule  2 times daily     08/04/18 1255          Portions of this note were generated with dragon dictation software. Dictation errors may occur despite best attempts at proofreading.    Sharman Cheek, MD 08/04/18 1258

## 2018-08-04 NOTE — ED Notes (Addendum)
Pt c/o nausea. Eating [employer] not vomiting at this time.

## 2018-08-04 NOTE — ED Triage Notes (Signed)
Pt presents with abdominal "pain." States she has been having "morning sickness" x 2 months. She reports that she had tubal ligation 13 years ago and wants to make sure she is not pregnant. Did home pregnancy test, which was negative. She indicates that she feels something move in her belly after eating or drinking and that it is hard. Pt alert & oriented with NAD noted.

## 2019-02-01 ENCOUNTER — Encounter: Payer: Self-pay | Admitting: Emergency Medicine

## 2019-02-01 ENCOUNTER — Emergency Department
Admission: EM | Admit: 2019-02-01 | Discharge: 2019-02-01 | Disposition: A | Discharge status: Home / Self Care | Payer: Self-pay | Attending: Emergency Medicine | Admitting: Emergency Medicine

## 2019-02-01 ENCOUNTER — Other Ambulatory Visit: Payer: Self-pay

## 2019-02-01 DIAGNOSIS — M5432 Sciatica, left side: Secondary | ICD-10-CM | POA: Insufficient documentation

## 2019-02-01 DIAGNOSIS — M5442 Lumbago with sciatica, left side: Secondary | ICD-10-CM

## 2019-02-01 DIAGNOSIS — Z79899 Other long term (current) drug therapy: Secondary | ICD-10-CM | POA: Insufficient documentation

## 2019-02-01 LAB — URINALYSIS, COMPLETE (UACMP) WITH MICROSCOPIC
Bacteria, UA: NONE SEEN
Bilirubin Urine: NEGATIVE
Glucose, UA: NEGATIVE mg/dL
Hgb urine dipstick: NEGATIVE
Ketones, ur: NEGATIVE mg/dL
Leukocytes,Ua: NEGATIVE
Nitrite: NEGATIVE
Protein, ur: NEGATIVE mg/dL
Specific Gravity, Urine: 1.026 (ref 1.005–1.030)
pH: 6 (ref 5.0–8.0)

## 2019-02-01 LAB — POCT PREGNANCY, URINE: PREG TEST UR: NEGATIVE

## 2019-02-01 MED ORDER — METHOCARBAMOL 500 MG PO TABS: 500.0000 mg | ORAL_TABLET | Freq: Three times a day (TID) | ORAL | 0 refills | Status: AC | PRN

## 2019-02-01 MED ORDER — PREDNISONE 10 MG (21) PO TBPK: ORAL_TABLET | ORAL | 0 refills | Status: DC

## 2019-02-01 NOTE — ED Provider Notes (Signed)
[facility] Emergency Department Provider Note  ____________________________________________  Time seen: Approximately 11:44 PM  I have reviewed the triage vital signs and the nursing notes.   HISTORY  Chief Complaint Leg Pain    HPI Kara Pearson is a 43 y.o. female presents to the emergency department with radiculopathy of the left lower extremity along with low back pain that has occurred intermittently for the past 3 weeks.  Patient also reports that she has had some intermittent tingling of the left upper extremity as well but she has had these symptoms for the past several months.  Patient denies falls or recent injuries.  No subjective weakness.  No bowel or bladder incontinence or saddle anesthesia.  Patient denies chest pain, chest tightness, nausea, vomiting or abdominal pain.   Past Medical History:  Diagnosis Date  . Anemia   . MI (myocardial infarction) [facility])     Patient Active Problem List   Diagnosis Date Noted  . NSTEMI (non-ST elevated myocardial infarction) (HCC) 08/12/2016  . Chest pain, rule out acute myocardial infarction 08/11/2016    Past Surgical History:  Procedure Laterality Date  . CARDIAC CATHETERIZATION N/A 08/13/2016   Procedure: Left Heart Cath and Coronary Angiography;  Surgeon: Laurier Nancy, MD;  Location: [facility] INVASIVE CV LAB;  Service: Cardiovascular;  Laterality: N/A;  . TUBAL LIGATION      Prior to Admission medications   Medication Sig Start Date End Date Taking? Authorizing Provider  methocarbamol (ROBAXIN) 500 MG tablet Take 1 tablet (500 mg total) by mouth every 8 (eight) hours as needed for up to 5 days. 02/01/19 02/06/19  Orvil Feil, PA-C  nitrofurantoin (MACRODANTIN) 100 MG capsule Take 1 capsule (100 mg total) by mouth 2 (two) times daily. 08/04/18   Sharman Cheek, MD  pantoprazole (PROTONIX) 40 MG tablet Take 1 tablet (40 mg total) by mouth daily. Patient not taking: Reported on 04/06/2017 08/13/16    Hower, Cletis Athens, MD  predniSONE (STERAPRED UNI-PAK 21 TAB) 10 MG (21) TBPK tablet Take 6 tablets the first day, take 5 tablets the second day, take 4 tablets the third day, take 3 tablets the fourth day, take 2 tablets the fifth day, take 1 tablet the sixth day. 02/01/19   Orvil Feil, PA-C  traMADol (ULTRAM) 50 MG tablet Take 1 tablet (50 mg total) by mouth every 6 (six) hours as needed. 10/25/17   Rebecka Apley, MD    Allergies Patient has no known allergies.  No family history on file.  Social History Social History   Tobacco Use  . Smoking status: Never Smoker  . Smokeless tobacco: Never Used  Substance Use Topics  . Alcohol use: No  . Drug use: No     Review of Systems  Constitutional: No fever/chills Eyes: No visual changes. No discharge ENT: No upper respiratory complaints. Cardiovascular: no chest pain. Respiratory: no cough. No SOB. Gastrointestinal: No abdominal pain.  No nausea, no vomiting.  No diarrhea.  No constipation. Genitourinary: Negative for dysuria. No hematuria Musculoskeletal: Negative for musculoskeletal pain.  Patient has left lower extremity radiculopathy. Skin: Negative for rash, abrasions, lacerations, ecchymosis. Neurological: Negative for headaches, focal weakness or numbness.   ____________________________________________   PHYSICAL EXAM:  VITAL SIGNS: ED Triage Vitals  Enc Vitals Group     BP 02/01/19 1707 (!) 142/99     Pulse Rate 02/01/19 1707 77     Resp 02/01/19 1707 16     Temp 02/01/19 1707 98.1 F (36.7  C)     Temp Source 02/01/19 1707 Oral     SpO2 02/01/19 1707 100 %     Weight --      Height --      Head Circumference --      Peak Flow --      Pain Score 02/01/19 1708 8     Pain Loc --      Pain Edu? --      Excl. in GC? --      Constitutional: Alert and oriented. Well appearing and in no acute distress. Eyes: Conjunctivae are normal. PERRL. EOMI. Head: Atraumatic. Cardiovascular: Normal rate, regular  rhythm. Normal S1 and S2.  Good peripheral circulation. Respiratory: Normal respiratory effort without tachypnea or retractions. Lungs CTAB. Good air entry to the bases with no decreased or absent breath sounds. Gastrointestinal: Bowel sounds 4 quadrants. Soft and nontender to palpation. No guarding or rigidity. No palpable masses. No distention. No CVA tenderness. Musculoskeletal: Patient has paraspinal muscle tenderness along the lumbar spine.  Positive straight leg raise, left. Neurologic:  Normal speech and language. No gross focal neurologic deficits are appreciated.  Skin:  Skin is warm, dry and intact. No rash noted. Psychiatric: Mood and affect are normal. Speech and behavior are normal. Patient exhibits appropriate insight and judgement.   ____________________________________________   LABS (all labs ordered are listed, but only abnormal results are displayed)  Labs Reviewed  URINALYSIS, COMPLETE (UACMP) WITH MICROSCOPIC - Abnormal; Notable for the following components:      Result Value   Color, Urine YELLOW (*)    APPearance CLEAR (*)    All other components within normal limits  POC URINE PREG, ED  POCT PREGNANCY, URINE   ____________________________________________  EKG   ____________________________________________  RADIOLOGY   No results found.  ____________________________________________    PROCEDURES  Procedure(s) performed:    Procedures    Medications - No data to display   ____________________________________________   INITIAL IMPRESSION / ASSESSMENT AND PLAN / ED COURSE  Pertinent labs & imaging results that were available during my care of the patient were reviewed by me and considered in my medical decision making (see chart for details).  Review of the [place] CSRS was performed in accordance of the [facility] prior to dispensing any controlled drugs.    Assessment and plan Low back pain with radiculopathy Patient presents to the emergency  department with low back pain with left lower extremity radiculopathy that has occurred for the past 3 weeks.  Neurologic exam and overall physical exam is reassuring.  Urine pregnancy test was negative.  Patient was given Decadron in the emergency department.  She was discharged with tapered prednisone and was also given Robaxin.  She was advised to follow-up with primary care as needed.  All patient questions were answered.    ____________________________________________  FINAL CLINICAL IMPRESSION(S) / ED DIAGNOSES  Final diagnoses:  Acute left-sided low back pain with left-sided sciatica      NEW MEDICATIONS STARTED DURING THIS VISIT:  ED Discharge Orders         Ordered    predniSONE (STERAPRED UNI-PAK 21 TAB) 10 MG (21) TBPK tablet     02/01/19 1841    methocarbamol (ROBAXIN) 500 MG tablet  Every 8 hours PRN     02/01/19 1841              This chart was dictated using voice recognition software/Dragon. Despite best efforts to proofread, errors can occur which can change the meaning.  Any change was purely unintentional.    Orvil Feil, PA-C 02/01/19 2348    Jeanmarie Plant, MD 02/05/19 [phone]

## 2019-02-01 NOTE — ED Triage Notes (Signed)
Pt c/o LFT leg numbness since yesterday with lower back pain. PT states intermit LFT hand numbness x1wk. PT denies any acute injury. EKG done because of cardiac hx

## 2019-02-01 NOTE — ED Notes (Signed)
Spoke with MD Roxan Hockey about pt presentation, okay for flex

## 2019-06-18 ENCOUNTER — Emergency Department
Admission: EM | Admit: 2019-06-18 | Discharge: 2019-06-19 | Disposition: A | Discharge status: Home / Self Care | Payer: Self-pay | Attending: Emergency Medicine | Admitting: Emergency Medicine

## 2019-06-18 ENCOUNTER — Other Ambulatory Visit: Payer: Self-pay

## 2019-06-18 DIAGNOSIS — Z79899 Other long term (current) drug therapy: Secondary | ICD-10-CM | POA: Insufficient documentation

## 2019-06-18 DIAGNOSIS — I252 Old myocardial infarction: Secondary | ICD-10-CM | POA: Insufficient documentation

## 2019-06-18 DIAGNOSIS — D649 Anemia, unspecified: Secondary | ICD-10-CM | POA: Insufficient documentation

## 2019-06-18 DIAGNOSIS — Z20828 Contact with and (suspected) exposure to other viral communicable diseases: Secondary | ICD-10-CM | POA: Insufficient documentation

## 2019-06-18 LAB — CBC WITH DIFFERENTIAL/PLATELET
Abs Immature Granulocytes: 0.01 10*3/uL (ref 0.00–0.07)
Basophils Absolute: 0.1 10*3/uL (ref 0.0–0.1)
Basophils Relative: 1 %
Eosinophils Absolute: 0.2 10*3/uL (ref 0.0–0.5)
Eosinophils Relative: 2 %
HCT: 26.4 % — ABNORMAL LOW (ref 36.0–46.0)
Hemoglobin: 7.6 g/dL — ABNORMAL LOW (ref 12.0–15.0)
Immature Granulocytes: 0 %
Lymphocytes Relative: 32 %
Lymphs Abs: 2.3 10*3/uL (ref 0.7–4.0)
MCH: 18.8 pg — ABNORMAL LOW (ref 26.0–34.0)
MCHC: 28.8 g/dL — ABNORMAL LOW (ref 30.0–36.0)
MCV: 65.3 fL — ABNORMAL LOW (ref 80.0–100.0)
Monocytes Absolute: 0.7 10*3/uL (ref 0.1–1.0)
Monocytes Relative: 10 %
Neutro Abs: 4 10*3/uL (ref 1.7–7.7)
Neutrophils Relative %: 55 %
Platelets: 307 10*3/uL (ref 150–400)
RBC: 4.04 MIL/uL (ref 3.87–5.11)
RDW: 19.8 % — ABNORMAL HIGH (ref 11.5–15.5)
WBC: 7.3 10*3/uL (ref 4.0–10.5)
nRBC: 0 % (ref 0.0–0.2)

## 2019-06-18 LAB — URINALYSIS, ROUTINE W REFLEX MICROSCOPIC
Bacteria, UA: NONE SEEN
Bilirubin Urine: NEGATIVE
Glucose, UA: NEGATIVE mg/dL
Ketones, ur: NEGATIVE mg/dL
Leukocytes,Ua: NEGATIVE
Nitrite: NEGATIVE
Protein, ur: NEGATIVE mg/dL
Specific Gravity, Urine: 1.006 (ref 1.005–1.030)
pH: 7 (ref 5.0–8.0)

## 2019-06-18 LAB — COMPREHENSIVE METABOLIC PANEL
ALT: 12 U/L (ref 0–44)
AST: 18 U/L (ref 15–41)
Albumin: 3.9 g/dL (ref 3.5–5.0)
Alkaline Phosphatase: 46 U/L (ref 38–126)
Anion gap: 10 (ref 5–15)
BUN: 14 mg/dL (ref 6–20)
CO2: 22 mmol/L (ref 22–32)
Calcium: 8.8 mg/dL — ABNORMAL LOW (ref 8.9–10.3)
Chloride: 105 mmol/L (ref 98–111)
Creatinine, Ser: 0.76 mg/dL (ref 0.44–1.00)
GFR calc Af Amer: >60 mL/min (ref >60)
GFR calc non Af Amer: >60 mL/min (ref >60)
Glucose, Bld: 96 mg/dL (ref 70–99)
Potassium: 3.8 mmol/L (ref 3.5–5.1)
Sodium: 137 mmol/L (ref 135–145)
Total Bilirubin: 0.3 mg/dL (ref 0.3–1.2)
Total Protein: 7.5 g/dL (ref 6.5–8.1)

## 2019-06-18 LAB — PREPARE RBC (CROSSMATCH)

## 2019-06-18 LAB — PREGNANCY, URINE: Preg Test, Ur: NEGATIVE

## 2019-06-18 LAB — ABO/RH: ABO/RH(D): O POS

## 2019-06-18 LAB — SARS CORONAVIRUS 2 BY RT PCR (HOSPITAL ORDER, PERFORMED IN ~~LOC~~ HOSPITAL LAB): SARS Coronavirus 2: NEGATIVE

## 2019-06-18 MED ORDER — SODIUM CHLORIDE 0.9% IV SOLUTION
Freq: Once | INTRAVENOUS | Status: DC
  Filled 2019-06-18: qty 250

## 2019-06-18 NOTE — ED Triage Notes (Signed)
Pt reports her period started last Saturday and last 4 days and then she started having hematuria and noting blood when she wiped - she states that she is anemic and "does not want to pass out from losing blood" - denies feeling of "passing out" - c/o lower back pain - reports bilat ankles swollen x2 weeks

## 2019-06-18 NOTE — ED Provider Notes (Addendum)
[facility] Emergency Department Provider Note  ____________________________________________   First MD Initiated Contact with Patient 06/18/19 2017     (approximate)  I have reviewed the triage vital signs and the nursing notes.   HISTORY  Chief Complaint Vaginal Bleeding    HPI Kara Pearson is a 43 y.o. female with anemia who presents for vaginal bleeding.  Patient menstruation started last Saturday.  Patient is concerned that she did was losing too much blood and she did not want to pass out from losing too much.  Patient said her period lasted 4 days but she continued to have some spotting afterwards.  She is currently not having any bleeding at this time.  The bleeding was moderate, intermittent, nothing made it better, nothing makes it worse.  She does endorse feeling lightheaded at this time.       Past Medical History:  Diagnosis Date  . Anemia   . MI (myocardial infarction) [facility](HCC)     Patient Active Problem List   Diagnosis Date Noted  . NSTEMI (non-ST elevated myocardial infarction) (HCC) 08/12/2016  . Chest pain, rule out acute myocardial infarction 08/11/2016    Past Surgical History:  Procedure Laterality Date  . CARDIAC CATHETERIZATION N/A 08/13/2016   Procedure: Left Heart Cath and Coronary Angiography;  Surgeon: Laurier NancyShaukat A Khan, MD;  Location: [facility] INVASIVE CV LAB;  Service: Cardiovascular;  Laterality: N/A;  . TUBAL LIGATION      Prior to Admission medications   Medication Sig Start Date End Date Taking? Authorizing Provider  nitrofurantoin (MACRODANTIN) 100 MG capsule Take 1 capsule (100 mg total) by mouth 2 (two) times daily. 08/04/18   Sharman CheekStafford, Phillip, MD  pantoprazole (PROTONIX) 40 MG tablet Take 1 tablet (40 mg total) by mouth daily. Patient not taking: Reported on 04/06/2017 08/13/16   Hower, Cletis Athensavid K, MD  predniSONE (STERAPRED UNI-PAK 21 TAB) 10 MG (21) TBPK tablet Take 6 tablets the first day, take 5 tablets the second day,  take 4 tablets the third day, take 3 tablets the fourth day, take 2 tablets the fifth day, take 1 tablet the sixth day. 02/01/19   Orvil FeilWoods, Jaclyn M, PA-C  traMADol (ULTRAM) 50 MG tablet Take 1 tablet (50 mg total) by mouth every 6 (six) hours as needed. 10/25/17   Rebecka ApleyWebster, Allison P, MD    Allergies Patient has no known allergies.  No family history on file.  Social History Social History   Tobacco Use  . Smoking status: Never Smoker  . Smokeless tobacco: Never Used  Substance Use Topics  . Alcohol use: No  . Drug use: No      Review of Systems Constitutional: No fever/chills lightheadedness. Eyes: No visual changes. ENT: No sore throat. Cardiovascular: Denies chest pain. Respiratory: Denies shortness of breath. Gastrointestinal: No abdominal pain.  No nausea, no vomiting.  No diarrhea.  No constipation. Genitourinary: Negative for dysuria. Musculoskeletal: Negative for back pain. Skin: Negative for rash. Neurological: Negative for headaches, focal weakness or numbness. All other ROS negative __Vaginal bleeding.  __________________________________________   PHYSICAL EXAM:  VITAL SIGNS: ED Triage Vitals  Enc Vitals Group     BP 06/18/19 1836 124/79     Pulse Rate 06/18/19 1836 78     Resp 06/18/19 1836 17     Temp 06/18/19 1836 98.7 F (37.1 C)     Temp Source 06/18/19 1836 Oral     SpO2 06/18/19 1836 98 %     Weight 06/18/19 1833 230 lb (  104.3 kg)     Height 06/18/19 1833 5\' 9"  (1.753 m)     Head Circumference --      Peak Flow --      Pain Score 06/18/19 1832 8     Pain Loc --      Pain Edu? --      Excl. in GC? --     Constitutional: Alert and oriented. Well appearing and in no acute distress. Eyes: Conjunctivae are normal. EOMI. pale conjunctive  Head: Atraumatic. Nose: No congestion/rhinnorhea. Mouth/Throat: Mucous membranes are moist.   Neck: No stridor. Trachea Midline. FROM Cardiovascular: Normal rate, regular rhythm. Grossly normal heart sounds.   Good peripheral circulation. Respiratory: Normal respiratory effort.  No retractions. Lungs CTAB. Gastrointestinal: Soft and nontender. No distention. No abdominal bruits.  Musculoskeletal: No lower extremity tenderness nor edema.  No joint effusions. Neurologic:  Normal speech and language. No gross focal neurologic deficits are appreciated.  Skin:  Skin is warm, dry and intact. No rash noted. Psychiatric: Mood and affect are normal. Speech and behavior are normal. GU: Deferred   ____________________________________________   LABS (all labs ordered are listed, but only abnormal results are displayed)  Labs Reviewed  CBC WITH DIFFERENTIAL/PLATELET - Abnormal; Notable for the following components:      Result Value   Hemoglobin 7.6 (*)    HCT 26.4 (*)    MCV 65.3 (*)    MCH 18.8 (*)    MCHC 28.8 (*)    RDW 19.8 (*)    All other components within normal limits  COMPREHENSIVE METABOLIC PANEL - Abnormal; Notable for the following components:   Calcium 8.8 (*)    All other components within normal limits  URINALYSIS, ROUTINE W REFLEX MICROSCOPIC - Abnormal; Notable for the following components:   Color, Urine COLORLESS (*)    APPearance CLEAR (*)    Hgb urine dipstick SMALL (*)    All other components within normal limits  SARS CORONAVIRUS 2 (HOSPITAL ORDER, PERFORMED IN [facility])  PREGNANCY, URINE  TYPE AND SCREEN  PREPARE RBC (CROSSMATCH)   ____________________________________________   ____________________________________________   PROCEDURES  Procedure(s) performed (including Critical Care):  .Critical Care Performed by: Concha SeFunke, Jerritt Cardoza E, MD Authorized by: Concha SeFunke, Rachele Lamaster E, MD   Critical care provider statement:    Critical care time (minutes):  30   Critical care was necessary to treat or prevent imminent or life-threatening deterioration of the following conditions: symptomtic anemia requiring blood transfusion.   Critical care was time spent  personally by me on the following activities:  Discussions with consultants, evaluation of patient's response to treatment, examination of patient, ordering and performing treatments and interventions, ordering and review of laboratory studies, ordering and review of radiographic studies, pulse oximetry, re-evaluation of patient's condition, obtaining history from patient or surrogate and review of old charts     ____________________________________________   INITIAL IMPRESSION / ASSESSMENT AND PLAN / ED COURSE  Kara Schilderlda M Jablonsky was evaluated in Emergency Department on 06/18/2019 for the symptoms described in the history of present illness. She was evaluated in the context of the global COVID-19 pandemic, which necessitated consideration that the patient might be at risk for infection with the SARS-CoV-2 virus that causes COVID-19. Institutional protocols and algorithms that pertain to the evaluation of patients at risk for COVID-19 are in a state of rapid change based on information released by regulatory bodies including the CDC and federal and state organizations. These policies and algorithms were followed during the  patient's care in the ED.    Patient is well-appearing but given her lightheadedness will get labs to evaluate for anemia.  Patient is no abdominal tenderness to suggest abdominal infection.  Will get urine evaluate for UTI.  Will get hCG to evaluate for pregnancy. No current bleeding.    Patient later endorsed some ankle swelling.  Legs are symmetric bilaterally.  Patient denies any shortness of breath.  Low suspicion for this being secondary to heart issues.  No evidence of pitting edema.  Recommend following up with her primary care doctor    Clinical Course as of Jun 17 2044  Mon Jun 18, 2019  2039 Hemoglobin(!): 7.6 [MF]    Clinical Course User Index [MF] Vanessa [place], MD    Patient's hCG is negative.  Will discuss with the medicine team for admission given her  symptomatic anemia.  Will transfuse 1 unit of blood.   GM recommend discussing with OB given vaginal bleeding.   Discussed with OB/GYN Dr. Georgianne Fick given patient has normal vital signs and no bleeding now they recommend sending patient home on iron.  Patient can call to make follow-up appointment, further ultrasounds to evaluate for the abnormal vaginal bleeding. ____________________________________________   FINAL CLINICAL IMPRESSION(S) / ED DIAGNOSES   Final diagnoses:  Symptomatic anemia      MEDICATIONS GIVEN DURING THIS VISIT:  Medications  ferrous sulfate tablet 325 mg (has no administration in time range)  0.9 %  sodium chloride infusion (Manually program via Guardrails IV Fluids) ( Intravenous New Bag/Given 06/18/19 2125)     ED Discharge Orders    None       Note:  This document was prepared using Dragon voice recognition software and may include unintentional dictation errors.   Vanessa [place], MD 06/18/19 2211    Vanessa [place], MD 06/19/19 0010    Vanessa [place], MD 06/29/19 1434

## 2019-06-19 LAB — TYPE AND SCREEN
ABO/RH(D): O POS
Antibody Screen: NEGATIVE
Unit division: 0

## 2019-06-19 LAB — BPAM RBC
Blood Product Expiration Date: 202008122359
ISSUE DATE / TIME: 202007202325
Unit Type and Rh: 5100

## 2019-06-19 MED ORDER — FERROUS SULFATE 325 (65 FE) MG PO TABS
325.0000 mg | ORAL_TABLET | Freq: Every day | ORAL | Status: DC
  Filled 2019-06-19: qty 1

## 2019-06-19 NOTE — Discharge Instructions (Signed)
We gave you 1 unit of blood.  Start taking iron.  If you can't afford the prescription and get over-the-counter iron.  You should call and make an appointment with the [facility] doctors in order to have further testing to figure out why you had the abnormal bleeding.  Return to the ER if you develop worsening lightheadedness or any other concerns.

## 2020-04-08 ENCOUNTER — Emergency Department (HOSPITAL_COMMUNITY): Payer: Self-pay

## 2020-04-08 ENCOUNTER — Encounter (HOSPITAL_COMMUNITY): Payer: Self-pay | Admitting: Emergency Medicine

## 2020-04-08 ENCOUNTER — Emergency Department (HOSPITAL_COMMUNITY)
Admission: EM | Admit: 2020-04-08 | Discharge: 2020-04-08 | Disposition: A | Discharge status: Home / Self Care | Payer: Self-pay | Attending: Emergency Medicine | Admitting: Emergency Medicine

## 2020-04-08 DIAGNOSIS — R0789 Other chest pain: Secondary | ICD-10-CM | POA: Insufficient documentation

## 2020-04-08 DIAGNOSIS — R1110 Vomiting, unspecified: Secondary | ICD-10-CM | POA: Insufficient documentation

## 2020-04-08 DIAGNOSIS — I252 Old myocardial infarction: Secondary | ICD-10-CM | POA: Insufficient documentation

## 2020-04-08 DIAGNOSIS — R202 Paresthesia of skin: Secondary | ICD-10-CM | POA: Insufficient documentation

## 2020-04-08 LAB — CBC
HCT: 39.1 % (ref 36.0–46.0)
Hemoglobin: 11.6 g/dL — ABNORMAL LOW (ref 12.0–15.0)
MCH: 23.2 pg — ABNORMAL LOW (ref 26.0–34.0)
MCHC: 29.7 g/dL — ABNORMAL LOW (ref 30.0–36.0)
MCV: 78.2 fL — ABNORMAL LOW (ref 80.0–100.0)
Platelets: 207 10*3/uL (ref 150–400)
RBC: 5 MIL/uL (ref 3.87–5.11)
RDW: 17.8 % — ABNORMAL HIGH (ref 11.5–15.5)
WBC: 9.1 10*3/uL (ref 4.0–10.5)
nRBC: 0 % (ref 0.0–0.2)

## 2020-04-08 LAB — BASIC METABOLIC PANEL
Anion gap: 9 (ref 5–15)
BUN: 12 mg/dL (ref 6–20)
CO2: 24 mmol/L (ref 22–32)
Calcium: 9.4 mg/dL (ref 8.9–10.3)
Chloride: 107 mmol/L (ref 98–111)
Creatinine, Ser: 0.76 mg/dL (ref 0.44–1.00)
GFR calc Af Amer: >60 mL/min (ref >60)
GFR calc non Af Amer: >60 mL/min (ref >60)
Glucose, Bld: 99 mg/dL (ref 70–99)
Potassium: 4.5 mmol/L (ref 3.5–5.1)
Sodium: 140 mmol/L (ref 135–145)

## 2020-04-08 LAB — I-STAT BETA HCG BLOOD, ED (MC, WL, AP ONLY): I-stat hCG, quantitative: <5 m[IU]/mL (ref <5)

## 2020-04-08 LAB — TROPONIN I (HIGH SENSITIVITY)
Troponin I (High Sensitivity): 6 ng/L (ref <18)
Troponin I (High Sensitivity): 4 ng/L (ref <18)

## 2020-04-08 MED ORDER — IBUPROFEN 400 MG PO TABS
600.0000 mg | ORAL_TABLET | Freq: Once | ORAL | Status: AC
  Administered 2020-04-08: 600 mg via ORAL
  Filled 2020-04-08: qty 1

## 2020-04-08 MED ORDER — SODIUM CHLORIDE 0.9% FLUSH: 3.0000 mL | Freq: Once | INTRAVENOUS | Status: DC

## 2020-04-08 MED ORDER — LIDOCAINE 5 % EX PTCH
1.0000 | MEDICATED_PATCH | CUTANEOUS | Status: DC
  Administered 2020-04-08: 1 via TRANSDERMAL
  Filled 2020-04-08: qty 1

## 2020-04-08 NOTE — ED Triage Notes (Addendum)
Pt arrives to ED with c/o of cp that started last night around 10pm and woke up her again at 0300- pt states currently left arm feels numb. Hx of MI

## 2020-04-08 NOTE — ED Notes (Signed)
Pt verbalized understanding of discharge instructions. Follow up care and pain management reviewed, pt had no further questions. 

## 2020-04-08 NOTE — ED Provider Notes (Signed)
[facility] EMERGENCY DEPARTMENT Provider Note   CSN: [id number] Arrival date & time: 04/08/20  0850     History Chief Complaint  Patient presents with  . Chest Pain    Kara Pearson is a 44 y.o. female presents to the ER for evaluation of chest pain.  This began at 9 PM yesterday while she was getting ready for bed.  Described as sharp pain above the left breast, nonradiating.  Initially was severe.  She took aspirin.  Not sure if it helped but was able to fall asleep.  Woke up at 3 AM and the pain was noticed again, milder, intermittent.  Had associated left are "numbness" she describes as tingling sensation involving the entire left hand and fingers.  This has been intermittent.  All of her left fingers were also swollen, which is now resolved.  States the chest pain felt similar to the heart attack she had 3 years ago so she became concerned.  Around 3 AM she was drinking water to help the chest pain and suddenly vomited twice.  She had no prodromal nausea, stomach pains and has not had any nausea, vomiting or abdominal discomfort since.  She threw up her dinner from last night.  She had 5 chicken wings.  Denies any recent trauma to the chest.  No recent exercise or muscular exertion.  No history of GERD or PUD.  No associated fever, cough, shortness of breath, palpitations, lightheadedness, syncope, peripheral edema or calf pain.  Patient denies any recent exertional chest pain or shortness of breath.  She tries to go on walks and has to do a lot of walking at work and states she never has any exertional symptoms.  Patient has no known medical problems.  States she was admitted for heart attack 3 to 4 years ago and had a heart cath and was told it was normal.  States she was not discharged with any medicines and has not needed to follow-up with cardiology since.  No history of tobacco use or illicit drug use.  No personal history of hypertension, hyperlipidemia, diabetes, blood  clots.  No family history of CAD.  PERC negative.     HPI  HPI: A 44 year old patient with a history of obesity presents for evaluation of chest pain. Initial onset of pain was approximately 1-3 hours ago. The patient's chest pain is well-localized, is sharp and is not worse with exertion. The patient's chest pain is middle- or left-sided, is not described as heaviness/pressure/tightness and does not radiate to the arms/jaw/neck. The patient does not complain of nausea and denies diaphoresis. The patient has no history of stroke, has no history of peripheral artery disease, has not smoked in the past 90 days, denies any history of treated diabetes, has no relevant family history of coronary artery disease (first degree relative at less than age 73), is not hypertensive and has no history of hypercholesterolemia.   Past Medical History:  Diagnosis Date  . Anemia   . MI (myocardial infarction) [facility])     Patient Active Problem List   Diagnosis Date Noted  . NSTEMI (non-ST elevated myocardial infarction) (HCC) 08/12/2016  . Chest pain, rule out acute myocardial infarction 08/11/2016    Past Surgical History:  Procedure Laterality Date  . CARDIAC CATHETERIZATION N/A 08/13/2016   Procedure: Left Heart Cath and Coronary Angiography;  Surgeon: Laurier Nancy, MD;  Location: [facility] INVASIVE CV LAB;  Service: Cardiovascular;  Laterality: N/A;  . TUBAL LIGATION  OB History   No obstetric history on file.     No family history on file.  Social History   Tobacco Use  . Smoking status: Never Smoker  . Smokeless tobacco: Never Used  Substance Use Topics  . Alcohol use: No  . Drug use: No    Home Medications Prior to Admission medications   Medication Sig Start Date End Date Taking? Authorizing Provider  nitrofurantoin (MACRODANTIN) 100 MG capsule Take 1 capsule (100 mg total) by mouth 2 (two) times daily. Patient not taking: Reported on 06/18/2019 08/04/18   Sharman Cheek, MD    pantoprazole (PROTONIX) 40 MG tablet Take 1 tablet (40 mg total) by mouth daily. Patient not taking: Reported on 04/06/2017 08/13/16   Hower, Cletis [place], MD  traMADol (ULTRAM) 50 MG tablet Take 1 tablet (50 mg total) by mouth every 6 (six) hours as needed. Patient not taking: Reported on 06/18/2019 10/25/17   Rebecka Apley, MD    Allergies    Patient has no known allergies.  Review of Systems   Review of Systems  Cardiovascular: Positive for chest pain.  Gastrointestinal: Positive for vomiting (x 2 resolved).  Neurological:       Left hand paresthesias   All other systems reviewed and are negative.   Physical Exam Updated Vital Signs BP (!) 150/92   Pulse (!) 54   Temp 98.1 F (36.7 C) (Oral)   Resp 18   LMP 03/11/2020   SpO2 98%   Physical Exam Constitutional:      Appearance: She is well-developed.     Comments: NAD. Non toxic.   HENT:     Head: Normocephalic and atraumatic.     Nose: Nose normal.  Eyes:     General: Lids are normal.     Conjunctiva/sclera: Conjunctivae normal.  Neck:     Trachea: Trachea normal.     Comments: Trachea midline.  Cardiovascular:     Rate and Rhythm: Normal rate and regular rhythm.     Pulses:          Radial pulses are 1+ on the right side and 1+ on the left side.       Dorsalis pedis pulses are 1+ on the right side and 1+ on the left side.     Heart sounds: Normal heart sounds, S1 normal and S2 normal.     Comments: Focal left chest wall tenderness above left breast, skin is normal.  No lower extremity edema.  No calf tenderness.   Pulmonary:     Effort: Pulmonary effort is normal.     Breath sounds: Normal breath sounds.  Chest:     Chest wall: Tenderness present.     Comments: Left chest wall tenderness.  Active movements against resistance of the left upper extremity reproduces left chest wall pain. Abdominal:     General: Bowel sounds are normal.     Palpations: Abdomen is soft.     Tenderness: There is no abdominal  tenderness.     Comments: Obese abdomen.  No epigastric or upper abdominal tenderness.  Soft, nontender throughout.  Musculoskeletal:     Cervical back: Normal range of motion.  Skin:    General: Skin is warm and dry.     Capillary Refill: Capillary refill takes less than 2 seconds.     Comments: No rash to chest wall  Neurological:     Mental Status: She is alert.     GCS: GCS eye subscore is 4. GCS verbal  subscore is 5. GCS motor subscore is 6.     Comments: Sensation and strength intact in upper and lower extremities  Psychiatric:        Speech: Speech normal.        Behavior: Behavior normal.        Thought Content: Thought content normal.     ED Results / Procedures / Treatments   Labs (all labs ordered are listed, but only abnormal results are displayed) Labs Reviewed  CBC - Abnormal; Notable for the following components:      Result Value   Hemoglobin 11.6 (*)    MCV 78.2 (*)    MCH 23.2 (*)    MCHC 29.7 (*)    RDW 17.8 (*)    All other components within normal limits  BASIC METABOLIC PANEL  I-STAT BETA HCG BLOOD, ED ([facility], [facility], [facility] ONLY)  TROPONIN I (HIGH SENSITIVITY)  TROPONIN I (HIGH SENSITIVITY)    EKG EKG Interpretation  Date/Time:  Tuesday Apr 08 2020 08:53:55 EDT Ventricular Rate:  63 PR Interval:  148 QRS Duration: 76 QT Interval:  386 QTC Calculation: 395 R Axis:   22 Text Interpretation: Normal sinus rhythm no acute ST/T changes similar to Mar 2020 Confirmed by Pricilla Loveless [phone]) on 04/08/2020 1:00:41 PM   Radiology DG Chest 2 View  Result Date: 04/08/2020 CLINICAL DATA:  Chest pain EXAM: CHEST - 2 VIEW COMPARISON:  08/11/2016 FINDINGS: The heart size and mediastinal contours are within normal limits. Both lungs are clear. The visualized skeletal structures are unremarkable. IMPRESSION: No active cardiopulmonary disease. Electronically Signed   By: Signa Kell M.D.   On: 04/08/2020 09:53    Procedures Procedures (including critical care  time)  Medications Ordered in ED Medications  sodium chloride flush (NS) 0.9 % injection 3 mL (3 mLs Intravenous Not Given 04/08/20 1310)  lidocaine (LIDODERM) 5 % 1 patch (1 patch Transdermal Patch Applied 04/08/20 1346)  ibuprofen (ADVIL) tablet 600 mg (600 mg Oral Given 04/08/20 1345)    ED Course  I have reviewed the triage vital signs and the nursing notes.  Pertinent labs & imaging results that were available during my care of the patient were reviewed by me and considered in my medical decision making (see chart for details).  Clinical Course as of Apr 08 1428  Tue Apr 08, 2020  1302 Troponin I (High Sensitivity): 6 [CG]  1302 DG Chest 2 View [CG]  1302 ED EKG within 10 minutes [CG]  1423 Troponin I (High Sensitivity): 4 [CG]    Clinical Course User Index [CG] Liberty Handy, PA-C   MDM Rules/Calculators/A&P HEAR Score: 1                    44 year old female with reported history of heart attack in 2017 presents for chest pain.  Patient's EMR reviewed to assist with MDM.  Admission in 2017 for elevated troponin was reviewed.  It appears she presented with atypical chest pain.  She had LHC which showed no CAD.  ER work-up initiated in triage, personally reviewed and interpreted.   EKG with no acute or ischemic changes.  Troponin undetectable.  Chest x-ray is normal.  PERC negative.  Other than elevated BMI patient has no other cardiac risk factors like hypertension, hyperlipidemia, diabetes, smoking or family history or known CAD.  She walks a lot for work and for exercise and states lately she has not had any exertional chest pain or shortness of breath.  She  has no reproducible epigastric abdominal tenderness.  No neuro pulse deficits distally on exam.    Considered unstable angina, MI unlikely given recent LHC and exam.  Doubt dissection, infectious process. PERC negative.  Chest pain sounds atypical.  At rest, intermittent.  Non exertional. Non pleuritic. Sharp, well  localized.  Reproducible with palpation and with active movement against resistance on exam.  Had subjective left hand tingling and finger swelling that completely resolved.  No neuro or pulse deficits.  No back or abdominal pain. As she was drinking water last night she vomited but states has not had any nausea or vomiting since.  Suspect atypical chest pain possibly MSK or GERD.  She threw up last night's dinner.  We will repeat second troponin.  We will give lidocaine patch and ibuprofen.  Will reassess.  1425: Trop 6 > 4.  CP significantly improved after ibuprofen/lidoderm.  Given symptomatology, exam, non ischemic cardiac work up in ER and HEART score patient is appropriate for discharge with cardiology f/u for continued symptoms.  ED return preacutions given. Pt appears reliable for follow up, aware of symptoms that would warrant return to ER.  Pt is comfortable and agreeable with ER POC and discharge plan.    Final Clinical Impression(s) / ED Diagnoses Final diagnoses:  Left-sided chest wall pain    Rx / DC Orders ED Discharge Orders    None       Kinnie Feil, PA-C 04/08/20 1429    Sherwood Gambler, MD 04/08/20 1544

## 2020-04-08 NOTE — Discharge Instructions (Addendum)
You were evaluated in the emergency department for chest pain.    Based on your symptoms, work up and exam you are considered low risk for major adverse cardiac events in the next 30 days.  This means you can be discharged with close follow up with follow up with primary care doctor or cardiology for further outpatient work up as needed, if your chest discomfort continues  Call your primary care doctor or cardiologist as soon as possible to establish care and further discussion and work up of your symptoms on an outpatient setting  For pain try using lidocaine or lidoderm patches (over the counter) every 12 hours.  You can also alternate ibuprofen or acetaminophen as needed for pain  Please return to ED if: Your chest pain is worse or on exertion You have a cough that gets worse, or you cough up blood. You have severe pain in chest, back or abdomen. You have chest pain with loss of sensation, weakness or tingling in extremities You have chest pain or shortness of breath with exertion or activity You have sudden, unexplained discomfort in your chest, with radiation arms, back, neck, or jaw. You suddenly have chest pain and begin to sweat, or your skin gets clammy. You feel chest pain with nausea or vomiting, blood in vomit You suddenly feel light-headed or faint. Your heart begins to beat quickly, or it feels like it is skipping beats. You have one sided leg swelling or calf pain You have chest pain with fever, chills, cough or other viral symptoms

## 2022-05-12 ENCOUNTER — Encounter (HOSPITAL_COMMUNITY): Payer: Self-pay | Admitting: Pharmacy Technician

## 2022-05-12 ENCOUNTER — Emergency Department (HOSPITAL_COMMUNITY)
Admission: EM | Admit: 2022-05-12 | Discharge: 2022-05-12 | Disposition: A | Discharge status: Home / Self Care | Payer: Self-pay | Attending: Emergency Medicine | Admitting: Emergency Medicine

## 2022-05-12 ENCOUNTER — Other Ambulatory Visit: Payer: Self-pay

## 2022-05-12 ENCOUNTER — Emergency Department (HOSPITAL_COMMUNITY): Payer: Self-pay

## 2022-05-12 DIAGNOSIS — R0602 Shortness of breath: Secondary | ICD-10-CM | POA: Insufficient documentation

## 2022-05-12 DIAGNOSIS — R079 Chest pain, unspecified: Secondary | ICD-10-CM | POA: Insufficient documentation

## 2022-05-12 DIAGNOSIS — I10 Essential (primary) hypertension: Secondary | ICD-10-CM | POA: Insufficient documentation

## 2022-05-12 LAB — COMPREHENSIVE METABOLIC PANEL
ALT: 10 U/L (ref 0–44)
AST: 12 U/L — ABNORMAL LOW (ref 15–41)
Albumin: 3.4 g/dL — ABNORMAL LOW (ref 3.5–5.0)
Alkaline Phosphatase: 47 U/L (ref 38–126)
Anion gap: 3 — ABNORMAL LOW (ref 5–15)
BUN: 6 mg/dL (ref 6–20)
CO2: 22 mmol/L (ref 22–32)
Calcium: 9.1 mg/dL (ref 8.9–10.3)
Chloride: 112 mmol/L — ABNORMAL HIGH (ref 98–111)
Creatinine, Ser: 0.85 mg/dL (ref 0.44–1.00)
GFR, Estimated: >60 mL/min (ref >60)
Glucose, Bld: 87 mg/dL (ref 70–99)
Potassium: 3.7 mmol/L (ref 3.5–5.1)
Sodium: 137 mmol/L (ref 135–145)
Total Bilirubin: 0.4 mg/dL (ref 0.3–1.2)
Total Protein: 6.7 g/dL (ref 6.5–8.1)

## 2022-05-12 LAB — CBC WITH DIFFERENTIAL/PLATELET
Abs Immature Granulocytes: 0.02 10*3/uL (ref 0.00–0.07)
Basophils Absolute: 0 10*3/uL (ref 0.0–0.1)
Basophils Relative: 1 %
Eosinophils Absolute: 0.2 10*3/uL (ref 0.0–0.5)
Eosinophils Relative: 3 %
HCT: 38.3 % (ref 36.0–46.0)
Hemoglobin: 12.3 g/dL (ref 12.0–15.0)
Immature Granulocytes: 0 %
Lymphocytes Relative: 36 %
Lymphs Abs: 1.8 10*3/uL (ref 0.7–4.0)
MCH: 26.3 pg (ref 26.0–34.0)
MCHC: 32.1 g/dL (ref 30.0–36.0)
MCV: 82 fL (ref 80.0–100.0)
Monocytes Absolute: 0.4 10*3/uL (ref 0.1–1.0)
Monocytes Relative: 9 %
Neutro Abs: 2.5 10*3/uL (ref 1.7–7.7)
Neutrophils Relative %: 51 %
Platelets: 187 10*3/uL (ref 150–400)
RBC: 4.67 MIL/uL (ref 3.87–5.11)
RDW: 14.9 % (ref 11.5–15.5)
WBC: 4.9 10*3/uL (ref 4.0–10.5)
nRBC: 0 % (ref 0.0–0.2)

## 2022-05-12 LAB — I-STAT BETA HCG BLOOD, ED (MC, WL, AP ONLY): I-stat hCG, quantitative: <5 m[IU]/mL (ref <5)

## 2022-05-12 LAB — URINALYSIS, ROUTINE W REFLEX MICROSCOPIC
Bilirubin Urine: NEGATIVE
Glucose, UA: NEGATIVE mg/dL
Hgb urine dipstick: NEGATIVE
Ketones, ur: NEGATIVE mg/dL
Leukocytes,Ua: NEGATIVE
Nitrite: NEGATIVE
Protein, ur: NEGATIVE mg/dL
Specific Gravity, Urine: 1.008 (ref 1.005–1.030)
pH: 8 (ref 5.0–8.0)

## 2022-05-12 LAB — LIPASE, BLOOD: Lipase: 25 U/L (ref 11–51)

## 2022-05-12 LAB — PROTIME-INR
INR: 1.1 (ref 0.8–1.2)
Prothrombin Time: 13.7 seconds (ref 11.4–15.2)

## 2022-05-12 LAB — TROPONIN I (HIGH SENSITIVITY)
Troponin I (High Sensitivity): 4 ng/L (ref <18)
Troponin I (High Sensitivity): 4 ng/L (ref <18)

## 2022-05-12 LAB — MAGNESIUM: Magnesium: 1.9 mg/dL (ref 1.7–2.4)

## 2022-05-12 LAB — BRAIN NATRIURETIC PEPTIDE: B Natriuretic Peptide: 61.9 pg/mL (ref 0.0–100.0)

## 2022-05-12 MED ORDER — MORPHINE SULFATE (PF) 4 MG/ML IV SOLN
4.0000 mg | Freq: Once | INTRAVENOUS | Status: AC
  Administered 2022-05-12: 4 mg via INTRAVENOUS
  Filled 2022-05-12: qty 1

## 2022-05-12 MED ORDER — NITROGLYCERIN 2 % TD OINT
1.0000 [in_us] | TOPICAL_OINTMENT | Freq: Once | TRANSDERMAL | Status: AC
  Administered 2022-05-12: 1 [in_us] via TOPICAL
  Filled 2022-05-12: qty 1

## 2022-05-12 NOTE — Discharge Instructions (Addendum)
Continue to check your blood pressure at home.  If blood pressure is consistently elevated greater than 140 on the top number or greater than 90 on the bottom number, you would benefit from medication for high blood pressure.  This would be managed through your primary care doctor.  If you do not have a primary care doctor, there is a telephone number below that you can call to get set up with one.  Return to the ED at any time for any further symptoms of concern.

## 2022-05-12 NOTE — ED Provider Notes (Signed)
[facility] EMERGENCY DEPARTMENT Provider Note   CSN: [id number] Arrival date & time: 05/12/22  [id number]     History  Chief Complaint  Patient presents with   Chest Pain   Hypertension         Kara Pearson is a 46 y.o. female.   Chest Pain Associated symptoms: headache   Hypertension Associated symptoms include chest pain and headaches.  Patient presents for chest pain and hypertension.  Medical history includes NSTEMI in 2017, GERD.  Cardiac catheterization at time of NSTEMI showed normal coronary arteries.  She has never been diagnosed with hypertension.  She is not prescribed any blood pressure medications.  She denies taking any daily medications.  She reports that she woke up yesterday with a sore throat.  She thought she was developing a URI.  She denies any other URI symptoms.  Last night, she took some Alka-Seltzer.  This morning, she woke up with a intense chest pressure, described as 10/10 in severity.  She also had a mild shortness of breath and headache.  EMS was called.  EMS reports an initial blood pressure of 240/140.  She was given 324 of ASA.  She was transported to the hospital.  During transport, patient's blood pressure improved.  She was not given any nitroglycerin.  Patient currently reports 8/10 severity chest discomfort, described as a pressure.  Headache has improved.     Home Medications Prior to Admission medications   Medication Sig Start Date End Date Taking? Authorizing Provider  ibuprofen (ADVIL) 200 MG tablet Take 200 mg by mouth every 6 (six) hours as needed (for headache).   Yes [provider]      Allergies    Patient has no known allergies.    Review of Systems   Review of Systems  Cardiovascular:  Positive for chest pain.  Neurological:  Positive for headaches.  All other systems reviewed and are negative.   Physical Exam Updated Vital Signs BP (!) 139/108   Pulse 66   Temp 98.2 F (36.8 C) (Oral) Comment:  Simultaneous filing. User may not have seen previous data. Comment (Src): Simultaneous filing. User may not have seen previous data.  Resp 10   SpO2 100%  Physical Exam Vitals and nursing note reviewed.  Constitutional:      General: She is not in acute distress.    Appearance: She is well-developed. She is not ill-appearing, toxic-appearing or diaphoretic.  HENT:     Head: Normocephalic and atraumatic.  Eyes:     Conjunctiva/sclera: Conjunctivae normal.  Neck:     Vascular: No JVD.  Cardiovascular:     Rate and Rhythm: Normal rate and regular rhythm.     Heart sounds: No murmur heard. Pulmonary:     Effort: Pulmonary effort is normal. No respiratory distress.     Breath sounds: Normal breath sounds. No decreased breath sounds, wheezing, rhonchi or rales.  Chest:     Chest wall: No tenderness.  Abdominal:     Palpations: Abdomen is soft.     Tenderness: There is no abdominal tenderness.  Musculoskeletal:        General: No swelling. Normal range of motion.     Cervical back: Normal range of motion and neck supple.     Right lower leg: No edema.     Left lower leg: No edema.  Skin:    General: Skin is warm and dry.     Coloration: Skin is not cyanotic or pale.  Neurological:     General: No focal deficit present.     Mental Status: She is alert and oriented to person, place, and time.     Cranial Nerves: Cranial nerves 2-12 are intact. No cranial nerve deficit, dysarthria or facial asymmetry.     Sensory: Sensation is intact. No sensory deficit.     Motor: Motor function is intact. No weakness, abnormal muscle tone or pronator drift.     Coordination: Coordination is intact.  Psychiatric:        Mood and Affect: Mood normal.     ED Results / Procedures / Treatments   Labs (all labs ordered are listed, but only abnormal results are displayed) Labs Reviewed  COMPREHENSIVE METABOLIC PANEL - Abnormal; Notable for the following components:      Result Value   Chloride 112  (*)    Albumin 3.4 (*)    AST 12 (*)    Anion gap 3 (*)    All other components within normal limits  URINALYSIS, ROUTINE W REFLEX MICROSCOPIC - Abnormal; Notable for the following components:   Color, Urine STRAW (*)    All other components within normal limits  MAGNESIUM  LIPASE, BLOOD  BRAIN NATRIURETIC PEPTIDE  PROTIME-INR  CBC WITH DIFFERENTIAL/PLATELET  I-STAT BETA HCG BLOOD, ED ([facility], [facility], [facility] ONLY)  TROPONIN I (HIGH SENSITIVITY)  TROPONIN I (HIGH SENSITIVITY)    EKG EKG Interpretation  Date/Time:  Wednesday May 12 2022 08:52:55 EDT Ventricular Rate:  63 PR Interval:  150 QRS Duration: 86 QT Interval:  396 QTC Calculation: 406 R Axis:   33 Text Interpretation: Sinus rhythm Atrial premature complex Confirmed by Godfrey Pick ([id number]) on 05/12/2022 9:11:39 AM  Radiology DG Chest Portable 1 View  Result Date: 05/12/2022 CLINICAL DATA:  46 year old female with sudden onset chest pain. History of NSTEMI. Hypertensive. EXAM: PORTABLE CHEST 1 VIEW COMPARISON:  Chest radiographs 04/08/2020 and earlier. FINDINGS: Portable AP semi upright view at 0906 hours. Lower lung volumes, accentuating the cardiac silhouette. Cardiac size and mediastinal contours appear to remain normal. Visualized tracheal air column is within normal limits. Allowing for portable technique the lungs are clear. No pneumothorax. No osseous abnormality identified. Negative visible bowel gas. IMPRESSION: Lower lung volumes.  No acute cardiopulmonary abnormality. Electronically Signed   By: Genevie Ann M.D.   On: 05/12/2022 09:11    Procedures Procedures    Medications Ordered in ED Medications  morphine (PF) 4 MG/ML injection 4 mg (4 mg Intravenous Given 05/12/22 0918)  nitroGLYCERIN (NITROGLYN) 2 % ointment 1 inch (1 inch Topical Given 05/12/22 [id number])    ED Course/ Medical Decision Making/ A&P                           Medical Decision Making Amount and/or Complexity of Data Reviewed Labs: ordered. Radiology:  ordered.  Risk Prescription drug management.   This patient presents to the ED for concern of chest pain, this involves an extensive number of treatment options, and is a complaint that carries with it a high risk of complications and morbidity.  The differential diagnosis includes ACS, pericarditis, GERD, anxiety, costochondritis, hypertensive emergency   Co morbidities that complicate the patient evaluation  GERD   Additional history obtained:  Additional history obtained from N/A External records from outside source obtained and reviewed including EMR   Lab Tests:  I Ordered, and personally interpreted labs.  The pertinent results include: Normal findings, including troponin x2   Imaging  Studies ordered:  I ordered imaging studies including chest x-ray I independently visualized and interpreted imaging which showed no acute findings I agree with the radiologist interpretation   Cardiac Monitoring: / EKG:  The patient was maintained on a cardiac monitor.  I personally viewed and interpreted the cardiac monitored which showed an underlying rhythm of: Sinus rhythm  Problem List / ED Course / Critical interventions / Medication management  Patient is a healthy 46 year old female presenting for acute onset of chest pain and pressure this morning.  Symptoms did wake her up from sleep.  Initial severity was 10/10.  EMS was called and EMS noted markedly elevated blood pressure on scene.  She was given 324 of ASA and transported to hospital.  Currently, patient reports improved symptoms.  Chest pain is currently 8/10 severity.  EKG shows no ST segment changes.  Laboratory work-up was initiated.  Patient was given morphine for analgesia.  NTG ointment was ordered for elevated blood pressure.  Patient underwent laboratory work-up.  Results of work-up were reassuring with normal hemoglobin, no leukocytosis, normal electrolytes, and normal troponins x2.  Patient had resolution of chest  discomfort while in the ED.  Blood pressure improved.  Patient does feel comfortable discharge home at this time.  Patient was advised to continue to monitor her blood pressure.  She was informed that if it stays elevated, she may benefit from starting on a blood pressure medication.  She was also encouraged to return to the ED at any time for any new or worsening symptoms.  She was discharged in good condition. I ordered medication including morphine for analgesia, NTG for hypertension Reevaluation of the patient after these medicines showed that the patient resolved I have reviewed the patients home medicines and have made adjustments as needed   Social Determinants of Health:  Does not have PCP        Final Clinical Impression(s) / ED Diagnoses Final diagnoses:  Chest pain, unspecified type  Hypertension, unspecified type    Rx / DC Orders ED Discharge Orders     None         Godfrey Pick, MD 05/12/22 1717

## 2022-05-12 NOTE — ED Notes (Signed)
EDP in to see pt at time of d/c 

## 2022-05-12 NOTE — ED Notes (Signed)
Got patient into a gown on the monitor did ekg shown to er provider patient is resting with call bell in reach  

## 2022-05-12 NOTE — ED Triage Notes (Signed)
Pt bib ems from work with sudden onset crushing heavy chest pain. Hx NSTEMI 4 years ago. Not currently taking any medications. Pt also reports malaise X4 days with sore throat. Pt noted to be hypertensive 240/140 initially. Last BP 158/100. Given 324mg  aspirin en route.
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
## 2022-08-28 NOTE — Congregational Nurse Program (Signed)
  Dept: [phone]   Congregational Nurse Program Note  Date of Encounter: 08/28/2022  Past Medical History: Past Medical History:  Diagnosis Date   Anemia    MI (myocardial infarction) [facility])     Encounter Details:  CNP Questionnaire - 08/28/22 0031       Questionnaire   Do you give verbal consent to treat you today? Yes    Location Patient Served  Not Applicable    Visit Setting Phone/Text/Email    Patient Status Unknown    Insurance Uninsured ([place] Card/Care Connects/Self-Pay)    Insurance Referral N/A    Medication N/A    Medical Provider No    Screening Referrals N/A    Medical Referral N/A    Medical Appointment Made N/A    Food Have Food Insecurities    Transportation N/A    Housing/Utilities No permanent housing    Interpersonal Safety Do not feel safe at current residence    [place]    ED Visit [place] N/A           Telephone conversation 08/25/22 with client (late entry). This nurse received voucher from [employer] [place] for eye exam and glasses at [place]. Client notified and agrees to come into clinic 09/01/22 to pick up voucher. Rhermann, RN
